# Patient Record
Sex: Female | Born: 1970 | Race: White | Hispanic: No | Marital: Single | Smoking: Never smoker
Health system: Southern US, Community
[De-identification: ages and names within clinical notes are randomized; demographics above are authoritative.]

## PROBLEM LIST (undated history)

## (undated) DIAGNOSIS — C73 Malignant neoplasm of thyroid gland: Secondary | ICD-10-CM

## (undated) HISTORY — PX: THYROID SURGERY: SHX805

## (undated) HISTORY — PX: OTHER SURGICAL HISTORY: SHX169

## (undated) HISTORY — PX: CHOLECYSTECTOMY: SHX55

## (undated) HISTORY — PX: HERNIA REPAIR: SHX51

---

## 2004-10-02 ENCOUNTER — Encounter (HOSPITAL_COMMUNITY): Admission: RE | Admit: 2004-10-02 | Discharge: 2004-12-31 | Payer: Self-pay | Admitting: Endocrinology

## 2005-11-12 ENCOUNTER — Encounter (HOSPITAL_COMMUNITY): Admission: RE | Admit: 2005-11-12 | Discharge: 2005-11-16 | Payer: Self-pay | Admitting: Endocrinology

## 2005-12-05 ENCOUNTER — Ambulatory Visit (HOSPITAL_COMMUNITY): Admission: RE | Admit: 2005-12-05 | Discharge: 2005-12-05 | Payer: Self-pay | Admitting: Endocrinology

## 2005-12-13 ENCOUNTER — Ambulatory Visit (HOSPITAL_COMMUNITY): Admission: RE | Admit: 2005-12-13 | Discharge: 2005-12-13 | Payer: Self-pay | Admitting: Endocrinology

## 2005-12-25 ENCOUNTER — Other Ambulatory Visit: Admission: RE | Admit: 2005-12-25 | Discharge: 2005-12-25 | Payer: Self-pay | Admitting: Interventional Radiology

## 2005-12-25 ENCOUNTER — Encounter (INDEPENDENT_AMBULATORY_CARE_PROVIDER_SITE_OTHER): Payer: Self-pay | Admitting: *Deleted

## 2005-12-25 ENCOUNTER — Encounter: Admission: RE | Admit: 2005-12-25 | Discharge: 2005-12-25 | Payer: Self-pay | Admitting: Endocrinology

## 2006-01-18 ENCOUNTER — Encounter (INDEPENDENT_AMBULATORY_CARE_PROVIDER_SITE_OTHER): Payer: Self-pay | Admitting: Specialist

## 2006-01-18 ENCOUNTER — Inpatient Hospital Stay (HOSPITAL_COMMUNITY): Admission: RE | Admit: 2006-01-18 | Discharge: 2006-01-21 | Payer: Self-pay | Admitting: Otolaryngology

## 2006-02-14 ENCOUNTER — Ambulatory Visit: Admission: RE | Admit: 2006-02-14 | Discharge: 2006-03-11 | Payer: Self-pay | Admitting: Radiation Oncology

## 2006-02-14 ENCOUNTER — Encounter: Admission: RE | Admit: 2006-02-14 | Discharge: 2006-02-14 | Payer: Self-pay | Admitting: Otolaryngology

## 2006-03-19 ENCOUNTER — Ambulatory Visit: Admission: RE | Admit: 2006-03-19 | Discharge: 2006-05-03 | Payer: Self-pay | Admitting: Radiation Oncology

## 2006-06-26 ENCOUNTER — Encounter: Admission: RE | Admit: 2006-06-26 | Discharge: 2006-06-26 | Payer: Self-pay | Admitting: Endocrinology

## 2006-07-26 ENCOUNTER — Ambulatory Visit (HOSPITAL_COMMUNITY): Admission: RE | Admit: 2006-07-26 | Discharge: 2006-07-26 | Payer: Self-pay | Admitting: Otolaryngology

## 2006-07-26 ENCOUNTER — Encounter (INDEPENDENT_AMBULATORY_CARE_PROVIDER_SITE_OTHER): Payer: Self-pay | Admitting: Specialist

## 2006-12-23 ENCOUNTER — Encounter: Admission: RE | Admit: 2006-12-23 | Discharge: 2006-12-23 | Payer: Self-pay | Admitting: Endocrinology

## 2006-12-25 ENCOUNTER — Encounter: Admission: RE | Admit: 2006-12-25 | Discharge: 2006-12-25 | Payer: Self-pay | Admitting: Endocrinology

## 2007-01-08 ENCOUNTER — Ambulatory Visit (HOSPITAL_COMMUNITY): Admission: RE | Admit: 2007-01-08 | Discharge: 2007-01-08 | Payer: Self-pay | Admitting: Endocrinology

## 2007-06-02 ENCOUNTER — Encounter (HOSPITAL_COMMUNITY): Admission: RE | Admit: 2007-06-02 | Discharge: 2007-07-23 | Payer: Self-pay | Admitting: Endocrinology

## 2007-06-04 ENCOUNTER — Ambulatory Visit (HOSPITAL_COMMUNITY): Admission: RE | Admit: 2007-06-04 | Discharge: 2007-06-04 | Payer: Self-pay | Admitting: Endocrinology

## 2008-01-14 ENCOUNTER — Encounter: Admission: RE | Admit: 2008-01-14 | Discharge: 2008-01-14 | Payer: Self-pay | Admitting: Endocrinology

## 2008-01-28 ENCOUNTER — Encounter: Admission: RE | Admit: 2008-01-28 | Discharge: 2008-01-28 | Payer: Self-pay | Admitting: Endocrinology

## 2008-02-19 ENCOUNTER — Other Ambulatory Visit: Admission: RE | Admit: 2008-02-19 | Discharge: 2008-02-19 | Payer: Self-pay | Admitting: Family Medicine

## 2008-04-13 ENCOUNTER — Encounter: Admission: RE | Admit: 2008-04-13 | Discharge: 2008-04-13 | Payer: Self-pay | Admitting: Endocrinology

## 2008-08-16 ENCOUNTER — Encounter (HOSPITAL_COMMUNITY): Admission: RE | Admit: 2008-08-16 | Discharge: 2008-10-28 | Payer: Self-pay | Admitting: Endocrinology

## 2008-08-18 ENCOUNTER — Ambulatory Visit (HOSPITAL_COMMUNITY): Admission: RE | Admit: 2008-08-18 | Discharge: 2008-08-18 | Payer: Self-pay | Admitting: Endocrinology

## 2010-11-18 ENCOUNTER — Encounter: Payer: Self-pay | Admitting: Endocrinology

## 2010-11-19 ENCOUNTER — Encounter: Payer: Self-pay | Admitting: Endocrinology

## 2011-02-27 ENCOUNTER — Other Ambulatory Visit: Payer: Self-pay | Admitting: Family Medicine

## 2011-02-27 ENCOUNTER — Ambulatory Visit
Admission: RE | Admit: 2011-02-27 | Discharge: 2011-02-27 | Disposition: A | Discharge status: Home / Self Care | Payer: Managed Care, Other (non HMO) | Source: Ambulatory Visit | Attending: Family Medicine | Admitting: Family Medicine

## 2011-02-27 DIAGNOSIS — R200 Anesthesia of skin: Secondary | ICD-10-CM

## 2011-03-16 NOTE — Op Note (Signed)
NAMEJOVONDA, SELNER                ACCOUNT NO.:  [id number]   MEDICAL RECORD NO.:  [id number]          PATIENT TYPE:  INP   LOCATION:  5704                         FACILITY:  [facility]   PHYSICIAN:  Kinnie Scales. 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   SURGICAL PROCEDURE:  Left type 1 modified radical neck dissection involving  zones 2, 3, 4, 5 and 6.   ANESTHESIA:  General endotracheal.   SURGEON:  Dr. Annalee Genta   ASSISTANT:  Dr. Flo Shanks.   COMPLICATIONS:  None.   ESTIMATED BLOOD LOSS:  Approximately 100 mL.   The patient transferred from the operating room to recovery room in stable  condition.   BRIEF HISTORY:  Ms. Selner is a 40 year old white female who was referred by  her endocrinologist, Dr. Dorisann Frames for evaluation of possible  metastatic papillary carcinoma of thyroid.  Patient had undergone prior  total thyroidectomy approximately 10 years prior to presentation and had  been followed closely through Dr. Willeen Cass office and was noted to have  rising thyroglobulin levels.  Workup including I-131 scanning was performed.  There is no evidence of significant uptake.  A CT PET scan did not show any  evidence of metastatic disease.  Ultrasound was performed.  The patient was  noted to have several approximately 1.5 cm nodules in the anterior inferior  aspect of the left neck adjacent to the paratracheal region.  Ultrasound-  guided needle biopsy was performed and pathology was consistent with  metastatic papillary carcinoma of the thyroid.  Despite prior therapy, the  patient now had recurrent disease in the neck.  Ultrasound showed multiple  small lymph nodes primarily the left neck and paratracheal region.  Given  the patient's  history, examination and physical findings, I recommended that  we undertake left neck dissection for removal of metastatic papillary  carcinoma of thyroid involving the left neck.  The risks, benefits and  possible complications of surgical procedure were discussed in detail with  the patient and her husband.  They understood and concurred with our plan  for surgery which was scheduled as above.   SURGICAL PROCEDURE:  The patient brought to the operating room [facility] OR on 01/18/2006, placed in supine position on the operating  table.  General endotracheal anesthesia established without difficulty.  When the patient was adequately anesthetized, she was positioned on the  operating table, prepped and draped in sterile fashion.  She was injected  with a total of 4 ml of 1% lidocaine 1:100,000 dilution epinephrine injected  in the deep subcutaneous tissue and proposed skin incision.  The patient's  surgical procedure was then begun by creating a curvilinear incision  beginning in the right anterior neck extending across the previous  thyroidectomy scar and along the left lateral neck to the level of the  mastoid tip.  Incision was carried  through the skin underlying deep  subcutaneous tissue.  The platysma muscle was identified and divided and  subplatysmal flaps elevated superiorly and inferiorly in order to allow  adequate access to the deep compartment of the neck.  There was palpable  nodular disease in the inferior aspect of the left neck at the inferior  aspect of the sternocleidomastoid muscle.  This was consistent with  metastatic thyroid carcinoma.  Dissection then carried out longitudinally  along the mid aspect of the left sternocleidomastoid muscle removing the  fibrofascial layer from mid to anterior and then carrying the dissection  along the underside of the sternocleidomastoid muscle removing fascial  tissue and lymph node bearing tissue.  Dissection was  carried from superior  to inferior exposing the entire sternocleidomastoid muscle.  The superior  aspect of the dissection then carried out along the tail of parotid and  dissection deep to the sternocleidomastoid muscle, identifying the posterior  belly of the digastric muscle.  The spinal accessory nerve was identified  and preserved throughout its course and underlying deep fatty and lymph node  bearing tissue was resected anteriorly and inferiorly.  There were multiple  matted lymph nodes in the left neck and at this point because of the  anatomic architecture, it was opted that we would take the jugular vein with  the surgical specimen.  The superior aspect of the dissection jugular vein  was divided and suture ligated and dissection then carried out along the  digastric muscle anteriorly.  The hypoglossal nerve was identified and  preserved and the overlying veins were dissected and suture ligated.  The  common facial vein was identified and divided and suture ligated.  Dissection was carried out on the anterior aspect of the dissection from  anterior zone 2 along the strap muscles identified the omohyoid.  The  previous surgical dissection in the anterior aspect of the neck was then  palpated and there was nodularity in this region and it was opted to  undertake an extensive zone 6 neck dissection as well.  With the tissues and  turned anteriorly zone 5 of the neck was then dissected preserving the  spinal accessory nerve and dissecting from superior to inferior.  Lymph node  bearing fatty tissue within Zone 5 of the neck was then resected anteriorly  preserving the cervical contributions to the phrenic nerve and brachial  plexus. The inferior aspect of the jugular vein was identified and this was  divided and suture ligated.  The thoracic duct was identified and left intact adjacent to the stump of the jugular vein. The vagus nerve was  identified and traced throughout as was the  carotid artery which was  preserved.  Dissection was then carried out superficial and anterior to the  carotid artery reflecting the lymph node bearing tissue anteriorly.  The  neck dissection was then reflected anteriorly across the strap muscles.  The  omohyoid muscle and sternothyroid muscles were divided. Zone 6 was then  explored beginning in the midline.  The strap muscles which had been  previously closed on themselves were divided and lateralized.  This allowed  direct access to the anterior compartment.  The trachea was palpated and  there were multiple areas of nodular disease in the left paratracheal region  and these were gently dissected from the surrounding tissue, removing all  apparent disease.  Dissection carried along the left aspect of the patient's  trachea and the recurrent laryngeal nerve was identified and preserved  throughout its  course from proximal to distal and its insertion identified  in the cricothyroid membrane.  The zone 6 dissection then separated and sent  to pathology as a separate anatomic specimen.  The remainder the neck  dissection was then removed from the surgical field along the anterior  aspect of the inferior carotid artery and sent to pathology.  This included  zones 2, 3, 4, and 5 of the left neck.  The patient's neck incision was then  thoroughly irrigated.  Several areas of point hemorrhage were cauterized  with bipolar cautery.  The wound was dry and intact at the conclusion of the  surgical procedure.  A 10-mm Blake drain was then placed through a separate  stab incision and sutured in place with the 3-0 Ethilon suture.  This  carried to the deep aspect of the wound.  The neck incision then closed in  multiple layers consisting of reapproximation of the platysma muscle with a  3-0 Vicryl suture.  Deep subcutaneous tissues closed with interrupted 4-0  Vicryl suture and final skin edges were approximated with a 5-0 Ethilon  suture in running  locked fashion.  The patient's neck skin was then cleaned  and dressed with bacitracin ointment.  She awakened from anesthetic,  extubated, and was then transferred from the operating room to recovery room  in stable condition.  There no complications.  Blood loss approximately 100  mL.           ______________________________  Kinnie Scales. Annalee Genta, M.D.     DLS/MEDQ  D:  91/47/8295  T:  01/21/2006  Job:  [id number]

## 2011-03-16 NOTE — Op Note (Signed)
Ebony Mahoney, Ebony Mahoney                ACCOUNT NO.:  [id number]   MEDICAL RECORD NO.:  [id number]          PATIENT TYPE:  AMB   LOCATION:  SDS                          FACILITY:  [facility]   PHYSICIAN:  Onalee Hua L. Annalee Genta, M.D.DATE OF BIRTH:  07-07-1971   DATE OF PROCEDURE:  07/26/2006  DATE OF DISCHARGE:  07/26/2006                                 OPERATIVE REPORT   INDICATIONS FOR SURGERY:  1. Left posterior deep neck mass.  2. History of metastatic papillary thyroid carcinoma.  3. Status post left neck dissection.   POSTOPERATIVE DIAGNOSES:  1. Left posterior deep neck mass.  2. History of metastatic papillary thyroid carcinoma.  3. Status post left neck dissection.   SURGICAL PROCEDURE:  Excisional biopsy left deep neck mass.   SURGEON:  Kinnie Scales. Annalee Genta, M.D.   ANESTHESIA:  General.   COMPLICATIONS:  None.   BLOOD LOSS:  Minimal.   The patient was transferred from the operating room to the recovery room in  stable condition.   BRIEF HISTORY:  Ms. Sinning is a 40 year old white female who has been  followed with a history of metastatic papillary thyroid carcinoma.  The  patient underwent initial thyroidectomy for papillary carcinoma  approximately 10 years ago.  She was stable until the last 18 months, when  she developed swelling in the left neck.  Biopsy was performed, and findings  were consistent with metastatic papillary thyroid carcinoma.  The patient  underwent neck dissection with extensive anterior compartment and zone VI  dissection performed in the spring of 2007.  In followup, the patient did  very well after her surgical procedure, and follow-up PET scanning and  thyroglobulin levels were stable.  The patient was noted on physical  examination to have a small, firm mass in the posterior aspect of the left  neck.  Palpation over several months reveals an approximately 1 cm firm  mass.  Given the patient's history and physical examination, we decided to  proceed  with surgical excision of the mass. This was scheduled as an  outpatient under general anesthesia at [facility]. The risks,  benefits, and possible complications of the surgical procedure were  discussed in detail with the patient, and she understood and concurred with  our plan for surgery, which was scheduled as above.   PROCEDURE:  The patient was brought to the operating room at [facility] on July 26, 2006.  She was placed in the supine position on  the operating table.  General endotracheal anesthesia was established  without difficulty, and the patient adequately anesthetized.  She was  positioned on the operating table and prepped and draped in a sterile  fashion.  The left neck was palpated and injected with about 1 cc of 1%  lidocaine with 1:100,000 epinephrine which was injected in a submucosal  fashion along the preexisting incision line.  The patient was then prepped  and draped in a sterile fashion, and the surgical procedure was begun by  creating an approximately 2 cm incision in the preexisting left neck scar.  This was carried through  the skin and underlying deep subcutaneous tissue.  The platysma muscle had been divided during the previous surgical procedure,  and the sternocleidomastoid muscle was palpated.  The area of concern was  deep to the sternocleidomastoid muscle in the posterior aspect of the neck.  The muscle was divided, and the spinal accessory nerve was identified  crossing the surgical field from superior to lateral. The nerve was  retracted gently and preserved throughout its course.  Dissection was then  carried out in the deep compartment of the neck, and a small nodular mass  was excised. This may have been consistent with scarred muscle or possible  neuroma. There was no other adenopathy or palpable mass.  The patient's  wound was then thoroughly irrigated with saline solution and closed in  layers, with reapproximation of the  sternocleidomastoid muscle with 4-0  Vicryl suture.  Deep subcutaneous tissue was closed with 4-0 Vicryl in an  interrupted fashion, and final skin closure was achieved with a 5-0 Ethilon  suture in a running-locked fashion.  The patient's wound was then dressed  with bacitracin ointment.  She was awakened from anesthetic, extubated, and  then transferred from the operating room to the recovery room in stable  condition.  No complications.  Blood loss was minimal.           ______________________________  Kinnie Scales. Annalee Genta, M.D.     DLS/MEDQ  D:  16/07/9603  T:  07/28/2006  Job:  [id number]

## 2011-03-16 NOTE — Discharge Summary (Signed)
NAMELETTI, TOWELL                ACCOUNT NO.:  [id number]   MEDICAL RECORD NO.:  [id number]          PATIENT TYPE:  INP   LOCATION:  5704                         FACILITY:  [facility]   PHYSICIAN:  Kinnie Scales. Annalee Genta, M.D.DATE OF BIRTH:  1970-12-25   DATE OF ADMISSION:  01/18/2006  DATE OF DISCHARGE:  01/21/2006                                 DISCHARGE SUMMARY   LOCATION:  [facility].   ADMISSION/DISCHARGE DIAGNOSES:  1.  Metastatic thyroid papillary carcinoma.  2.  Status post prior total thyroidectomy.  3.  Radioactive iodine therapy.   SURGICAL PROCEDURES THIS ADMISSION:  Left modified radical neck dissection  performed on 12/21/05.  The patient was discharged to home in stable  condition at the company of her husband.   DISCHARGE MEDICATIONS:  1.  Augmentin 500 mg p.o. b.i.d. for 10 days.  2. Percocet 5/325 one to two      p.o. q.4-6 h. as needed for pain.  3. Tums chewable, 2 tablets 3 times a      day.   WOUND CARE:  Half strength hydrogen peroxide and Bacitracin ointment on  twice daily basis.   DIETARY RESTRICTIONS:  None.   PHYSICAL ACTIVITY:  Restricted to walking.  No lifting, straining, or  driving for 2 weeks.  The patient may bathe and shower on January 23, 2006.   BRIEF HISTORY:  Ms. Sapien is a 40 year old white female who has been  followed closely by her endocrinologist, Dr. Dorisann Frames, for long-term  management of thyroid carcinoma.  The patient underwent a total  thyroidectomy approximately 10 years ago while living out of sate for  papillary carcinoma of the thyroid.  She was treated with multiple courses  of radioactive iodine and appeared to be disease free.  Over the last 3-4  months, she has had increasing levels of thyroglobulin and workup through  Dr. Willeen Cass office included repeat radioactive iodine therapy and PET CT  scan showed no evidence of recurrent disease.  A followup ultrasound showed  lymph nodes in the left neck, paratracheal  region.  Ultrasound guided biopsy  was positive for metastatic papillary carcinoma.  Given the patient's  history and examination, I recommended surgical intervention to include  residual disease.  In discussing the risks, benefits of possible  complications of procedure, we discussed a left neck dissection, possible  modified radical neck dissection and possible zone 6 neck dissection.  The  patient and her husband understood and conferred with our plan for surgery,  which was scheduled for 01/18/06.   HOSPITAL COURSE:  The patient was admitted to [facility] on  01/18/06, and taken from day surgery to the operating room where she  underwent a left neck dissection under general anesthesia.  This consisted  of a modified radical neck dissection preserving the spinal accessory nerve  and sternocleidomastoid muscles sacrificing the jugular vein.  The patient  had apparent metastatic disease in zones 4, 5 and 6 and the neck dissection  included zones 2, 3, 4, 5, and separate section through the previous  thyroidectomy bed and anterior compartment of  the neck (zone 6).  The  patient's surgical procedure was successful and without complications.  She  was transferred to the operating room to the recovery room in stable  condition.   The patient was transferred from recovery to unit 5700 for postoperative  care.  Postoperative course was uneventful and the patient had good pain  control using prescribed oral narcotics.  She did complain of severe  headache, which was treated with intravenous morphine.  The patient's  calcium level was borderline low and she was treated with multivitamin with  calcium.  She was stable without symptoms of hypocalcemia.  She had good  voicing and was tolerating oral diet without difficulty.   On the third postoperative day, the patient's Al Pimple drain output had  fallen to less than 10 mL per shift and the drain was removed without  difficulty.  On  the morning of 01/21/06, the patient's incision was dry and  intact.  Minimal peri-incisional ecchymoses, no erythema or swelling.  The  patient had normal bowel and bladder function, was ambulating well without  difficulty.  She was discharged to home in stable condition in the company  of her husband with the above discharge instructions.   FOLLOWUP PLAN:  The patient will follow up in my office on 01/27/06, for  further postoperative care.           ______________________________  Kinnie Scales. Annalee Genta, M.D.     DLS/MEDQ  D:  16/07/9603  T:  01/22/2006  Job:  [id number]   cc:   Dorisann Frames, M.D.  Fax: [phone]

## 2011-07-30 LAB — HCG, SERUM, QUALITATIVE: Preg, Serum: NEGATIVE

## 2011-08-13 LAB — HCG, SERUM, QUALITATIVE: Preg, Serum: NEGATIVE

## 2011-10-11 ENCOUNTER — Encounter: Payer: Self-pay | Admitting: Emergency Medicine

## 2011-10-11 ENCOUNTER — Emergency Department (HOSPITAL_COMMUNITY): Payer: Managed Care, Other (non HMO)

## 2011-10-11 ENCOUNTER — Emergency Department (HOSPITAL_COMMUNITY)
Admission: EM | Admit: 2011-10-11 | Discharge: 2011-10-11 | Disposition: A | Discharge status: Home / Self Care | Payer: Managed Care, Other (non HMO) | Attending: Emergency Medicine | Admitting: Emergency Medicine

## 2011-10-11 DIAGNOSIS — Z85850 Personal history of malignant neoplasm of thyroid: Secondary | ICD-10-CM | POA: Insufficient documentation

## 2011-10-11 DIAGNOSIS — R51 Headache: Secondary | ICD-10-CM | POA: Insufficient documentation

## 2011-10-11 DIAGNOSIS — M542 Cervicalgia: Secondary | ICD-10-CM | POA: Insufficient documentation

## 2011-10-11 MED ORDER — OXYCODONE-ACETAMINOPHEN 5-325 MG PO TABS
1.0000 | ORAL_TABLET | Freq: Once | ORAL | Status: AC
  Administered 2011-10-11: 1 via ORAL
  Filled 2011-10-11: qty 1

## 2011-10-11 MED ORDER — DIAZEPAM 5 MG PO TABS: 5.0000 mg | ORAL_TABLET | Freq: Two times a day (BID) | ORAL | Status: AC

## 2011-10-11 MED ORDER — IBUPROFEN 800 MG PO TABS
800.0000 mg | ORAL_TABLET | Freq: Once | ORAL | Status: AC
  Administered 2011-10-11: 800 mg via ORAL
  Filled 2011-10-11: qty 1

## 2011-10-11 MED ORDER — LORAZEPAM 2 MG/ML IJ SOLN
1.0000 mg | Freq: Once | INTRAMUSCULAR | Status: AC
  Administered 2011-10-11: 1 mg via INTRAVENOUS
  Filled 2011-10-11: qty 1

## 2011-10-11 MED ORDER — OXYCODONE-ACETAMINOPHEN 5-325 MG PO TABS: 2.0000 | ORAL_TABLET | ORAL | Status: AC | PRN

## 2011-10-11 MED ORDER — IBUPROFEN 600 MG PO TABS: 600.0000 mg | ORAL_TABLET | Freq: Four times a day (QID) | ORAL | Status: AC | PRN
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
## 2011-10-11 NOTE — ED Notes (Signed)
Hx of multiple neck surgeries
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
## 2011-10-12 NOTE — ED Provider Notes (Signed)
Medical screening examination/treatment/procedure(s) were performed by non-physician practitioner and as supervising physician I was immediately available for consultation/collaboration.   Yobana Culliton A Madilyne Tadlock, MD 10/12/11 1416 

## 2011-11-12 ENCOUNTER — Other Ambulatory Visit (HOSPITAL_COMMUNITY): Payer: Self-pay | Admitting: Endocrinology

## 2011-11-12 DIAGNOSIS — C73 Malignant neoplasm of thyroid gland: Secondary | ICD-10-CM

## 2011-12-10 ENCOUNTER — Encounter (HOSPITAL_COMMUNITY)
Admission: RE | Admit: 2011-12-10 | Discharge: 2011-12-10 | Disposition: A | Discharge status: Home / Self Care | Payer: Managed Care, Other (non HMO) | Source: Ambulatory Visit | Attending: Endocrinology | Admitting: Endocrinology

## 2011-12-10 DIAGNOSIS — C73 Malignant neoplasm of thyroid gland: Secondary | ICD-10-CM

## 2011-12-10 MED ORDER — THYROTROPIN ALFA 1.1 MG IM SOLR
0.9000 mg | INTRAMUSCULAR | Status: DC
  Filled 2011-12-10: qty 0.9

## 2011-12-11 ENCOUNTER — Ambulatory Visit (HOSPITAL_COMMUNITY)
Admission: RE | Admit: 2011-12-11 | Discharge: 2011-12-11 | Disposition: A | Discharge status: Home / Self Care | Payer: Managed Care, Other (non HMO) | Source: Ambulatory Visit | Attending: Endocrinology | Admitting: Endocrinology

## 2011-12-12 ENCOUNTER — Encounter (HOSPITAL_COMMUNITY)
Admission: RE | Admit: 2011-12-12 | Discharge: 2011-12-12 | Disposition: A | Discharge status: Home / Self Care | Payer: Managed Care, Other (non HMO) | Source: Ambulatory Visit | Attending: Endocrinology | Admitting: Endocrinology

## 2011-12-12 LAB — HCG, SERUM, QUALITATIVE: Preg, Serum: NEGATIVE

## 2011-12-14 ENCOUNTER — Encounter (HOSPITAL_COMMUNITY)
Admission: RE | Admit: 2011-12-14 | Discharge: 2011-12-14 | Disposition: A | Discharge status: Home / Self Care | Payer: Managed Care, Other (non HMO) | Source: Ambulatory Visit | Attending: Endocrinology | Admitting: Endocrinology

## 2011-12-14 ENCOUNTER — Encounter (HOSPITAL_COMMUNITY): Payer: Self-pay

## 2011-12-14 HISTORY — DX: Malignant neoplasm of thyroid gland: C73

## 2011-12-14 MED ORDER — SODIUM IODIDE I 131 CAPSULE
4.2000 | Freq: Once | INTRAVENOUS | Status: AC | PRN
  Administered 2011-12-14: 4.2 via ORAL

## 2012-09-05 MED FILL — Thyrotropin Alfa For Inj 1.1 MG: INTRAMUSCULAR | Qty: 0.9 | Status: AC

## 2012-10-13 ENCOUNTER — Other Ambulatory Visit: Payer: Self-pay | Admitting: Family Medicine

## 2012-10-13 ENCOUNTER — Other Ambulatory Visit (HOSPITAL_COMMUNITY)
Admission: RE | Admit: 2012-10-13 | Discharge: 2012-10-13 | Disposition: A | Discharge status: Home / Self Care | Payer: Managed Care, Other (non HMO) | Source: Ambulatory Visit | Attending: Endocrinology | Admitting: Endocrinology

## 2012-10-13 DIAGNOSIS — Z0000 Encounter for general adult medical examination without abnormal findings: Secondary | ICD-10-CM | POA: Insufficient documentation

## 2012-10-13 DIAGNOSIS — N6320 Unspecified lump in the left breast, unspecified quadrant: Secondary | ICD-10-CM

## 2012-10-13 DIAGNOSIS — Z113 Encounter for screening for infections with a predominantly sexual mode of transmission: Secondary | ICD-10-CM | POA: Insufficient documentation

## 2012-10-23 ENCOUNTER — Ambulatory Visit
Admission: RE | Admit: 2012-10-23 | Discharge: 2012-10-23 | Disposition: A | Discharge status: Home / Self Care | Payer: Managed Care, Other (non HMO) | Source: Ambulatory Visit | Attending: Family Medicine | Admitting: Family Medicine

## 2012-10-23 DIAGNOSIS — N6320 Unspecified lump in the left breast, unspecified quadrant: Secondary | ICD-10-CM

## 2013-10-05 ENCOUNTER — Other Ambulatory Visit: Payer: Self-pay

## 2013-10-05 DIAGNOSIS — Z1231 Encounter for screening mammogram for malignant neoplasm of breast: Secondary | ICD-10-CM

## 2013-10-19 ENCOUNTER — Ambulatory Visit
Admission: RE | Admit: 2013-10-19 | Discharge: 2013-10-19 | Disposition: A | Discharge status: Home / Self Care | Payer: Managed Care, Other (non HMO) | Source: Ambulatory Visit | Attending: Family | Admitting: Family

## 2013-10-19 ENCOUNTER — Other Ambulatory Visit: Payer: Self-pay | Admitting: Family

## 2013-10-19 DIAGNOSIS — M549 Dorsalgia, unspecified: Secondary | ICD-10-CM

## 2013-10-28 ENCOUNTER — Ambulatory Visit
Admission: RE | Admit: 2013-10-28 | Discharge: 2013-10-28 | Disposition: A | Discharge status: Home / Self Care | Payer: Managed Care, Other (non HMO) | Source: Ambulatory Visit

## 2013-10-28 DIAGNOSIS — Z1231 Encounter for screening mammogram for malignant neoplasm of breast: Secondary | ICD-10-CM

## 2013-11-09 ENCOUNTER — Ambulatory Visit: Payer: Managed Care, Other (non HMO)

## 2014-08-24 ENCOUNTER — Other Ambulatory Visit: Payer: Self-pay | Admitting: Family Medicine

## 2014-08-24 ENCOUNTER — Other Ambulatory Visit (HOSPITAL_COMMUNITY)
Admission: RE | Admit: 2014-08-24 | Discharge: 2014-08-24 | Disposition: A | Discharge status: Home / Self Care | Payer: Managed Care, Other (non HMO) | Source: Ambulatory Visit | Attending: Family Medicine | Admitting: Family Medicine

## 2014-08-24 DIAGNOSIS — Z01411 Encounter for gynecological examination (general) (routine) with abnormal findings: Secondary | ICD-10-CM | POA: Diagnosis present

## 2014-08-26 LAB — CYTOLOGY - PAP

## 2014-09-07 ENCOUNTER — Other Ambulatory Visit: Payer: Self-pay | Admitting: Obstetrics & Gynecology

## 2014-09-16 ENCOUNTER — Other Ambulatory Visit: Payer: Self-pay

## 2014-09-16 DIAGNOSIS — Z1231 Encounter for screening mammogram for malignant neoplasm of breast: Secondary | ICD-10-CM

## 2014-11-01 ENCOUNTER — Ambulatory Visit
Admission: RE | Admit: 2014-11-01 | Discharge: 2014-11-01 | Disposition: A | Discharge status: Home / Self Care | Payer: BLUE CROSS/BLUE SHIELD | Source: Ambulatory Visit

## 2014-11-01 DIAGNOSIS — Z1231 Encounter for screening mammogram for malignant neoplasm of breast: Secondary | ICD-10-CM

## 2015-09-12 ENCOUNTER — Other Ambulatory Visit: Payer: Self-pay | Admitting: Family Medicine

## 2015-09-12 ENCOUNTER — Ambulatory Visit
Admission: RE | Admit: 2015-09-12 | Discharge: 2015-09-12 | Disposition: A | Discharge status: Home / Self Care | Payer: BLUE CROSS/BLUE SHIELD | Source: Ambulatory Visit | Attending: Family Medicine | Admitting: Family Medicine

## 2015-09-12 DIAGNOSIS — R053 Chronic cough: Secondary | ICD-10-CM

## 2015-09-12 DIAGNOSIS — R05 Cough: Secondary | ICD-10-CM

## 2015-12-07 ENCOUNTER — Other Ambulatory Visit: Payer: Self-pay

## 2015-12-07 DIAGNOSIS — Z1231 Encounter for screening mammogram for malignant neoplasm of breast: Secondary | ICD-10-CM

## 2016-03-05 ENCOUNTER — Ambulatory Visit
Admission: RE | Admit: 2016-03-05 | Discharge: 2016-03-05 | Disposition: A | Discharge status: Home / Self Care | Payer: BLUE CROSS/BLUE SHIELD | Source: Ambulatory Visit

## 2016-03-05 DIAGNOSIS — Z1231 Encounter for screening mammogram for malignant neoplasm of breast: Secondary | ICD-10-CM

## 2016-08-31 ENCOUNTER — Other Ambulatory Visit: Payer: Self-pay | Admitting: Family Medicine

## 2016-08-31 ENCOUNTER — Ambulatory Visit
Admission: RE | Admit: 2016-08-31 | Discharge: 2016-08-31 | Disposition: A | Discharge status: Home / Self Care | Payer: BLUE CROSS/BLUE SHIELD | Source: Ambulatory Visit | Attending: Family Medicine | Admitting: Family Medicine

## 2016-08-31 DIAGNOSIS — N200 Calculus of kidney: Secondary | ICD-10-CM

## 2018-07-07 ENCOUNTER — Other Ambulatory Visit: Payer: Self-pay | Admitting: Obstetrics & Gynecology

## 2018-07-07 ENCOUNTER — Ambulatory Visit
Admission: RE | Admit: 2018-07-07 | Discharge: 2018-07-07 | Disposition: A | Discharge status: Home / Self Care | Payer: Commercial Managed Care - PPO | Source: Ambulatory Visit | Attending: Obstetrics & Gynecology | Admitting: Obstetrics & Gynecology

## 2018-07-07 DIAGNOSIS — Z1231 Encounter for screening mammogram for malignant neoplasm of breast: Secondary | ICD-10-CM

## 2019-06-24 ENCOUNTER — Other Ambulatory Visit: Payer: Self-pay | Admitting: Family Medicine

## 2019-06-24 ENCOUNTER — Ambulatory Visit
Admission: RE | Admit: 2019-06-24 | Discharge: 2019-06-24 | Disposition: A | Discharge status: Home / Self Care | Payer: Commercial Managed Care - PPO | Source: Ambulatory Visit | Attending: Family Medicine | Admitting: Family Medicine

## 2019-06-24 DIAGNOSIS — R0602 Shortness of breath: Secondary | ICD-10-CM

## 2019-11-17 ENCOUNTER — Ambulatory Visit: Payer: Commercial Managed Care - PPO | Admitting: Internal Medicine

## 2019-11-17 ENCOUNTER — Encounter: Payer: Self-pay | Admitting: Internal Medicine

## 2019-11-17 ENCOUNTER — Other Ambulatory Visit: Payer: Self-pay

## 2019-11-17 VITALS — BP 132/80 | HR 71 | Temp 98.3°F | Ht 60.0 in | Wt 176.6 lb

## 2019-11-17 DIAGNOSIS — R05 Cough: Secondary | ICD-10-CM | POA: Diagnosis not present

## 2019-11-17 DIAGNOSIS — R059 Cough, unspecified: Secondary | ICD-10-CM

## 2019-11-17 MED ORDER — LORATADINE 10 MG PO TABS: 10.0000 mg | ORAL_TABLET | Freq: Every day | ORAL | 3 refills | Status: DC

## 2019-11-17 MED ORDER — FAMOTIDINE 20 MG PO TABS: 20.0000 mg | ORAL_TABLET | Freq: Two times a day (BID) | ORAL | 3 refills | Status: DC

## 2019-11-17 MED ORDER — OMEPRAZOLE 20 MG PO CPDR
20.0000 mg | DELAYED_RELEASE_CAPSULE | Freq: Two times a day (BID) | ORAL | 3 refills | Status: DC
Start: 2019-11-17 — End: 2020-02-08

## 2019-11-17 MED ORDER — MONTELUKAST SODIUM 10 MG PO TABS: 10.0000 mg | ORAL_TABLET | Freq: Every day | ORAL | 3 refills | Status: DC

## 2019-11-17 NOTE — Progress Notes (Signed)
X °

## 2019-11-17 NOTE — Progress Notes (Signed)
Synopsis: Referred in Jan 2021 for cough by Jacelyn Pi, MD  Subjective:   PATIENT ID: Ebony Mahoney GENDER: female DOB: 04-14-1971, MRN: [id number]  Chief Complaint  Patient presents with  . Consult    Patient is here for chronic dry cough and feeling like she constantly has to clear her throat. Patient was started on Albuterol rescue inhaler and Flovent HFA but has not noticed a difference.    HPI  Here for 3-4 months of cough - No preceding illness - No recent travel - No wheezing - Nonproductive - Almost like an itching in back of throat - No history of allergies or asthma - Is on omeprazole for GERD - On CPAP but cleaning machine regularly - No aspiration symptoms - No obvious exposures - Minimal postnasal drip - Worsened by talking - No benefit to trial of ICS and albuterol  Also chest tightness - Mild, constant, unaffected by time of day, position, activity, breathing - Midsternum in location - No relieving or exacerbating factors that we could figure out - No obvious relationship to cough either  Notably has history of thyroid 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, records not available on Epic due to being so long ago (2008)  ROS Positive Symptoms in bold:  Constitutional fevers, chills, weight loss, fatigue, anorexia, malaise  Eyes decreased vision, double vision, eye irritation  Ears, Nose, Mouth, Throat sore throat, trouble swallowing, sinus congestion  Cardiovascular chest pain, paroxysmal nocturnal dyspnea, lower ext edema, palpitations   Respiratory SOB, cough, DOE, hemoptysis, wheezing  Gastrointestinal nausea, vomiting, diarrhea  Genitourinary burning with urination, trouble urinating  Musculoskeletal joint aches, joint swelling, back pain  Integumentary  rashes, skin lesions  Neurological focal weakness, focal numbness, trouble speaking, headaches  Psychiatric depression, anxiety, confusion  Endocrine polyuria,  polydipsia, cold intolerance, heat intolerance  Hematologic abnormal bruising, abnormal bleeding, unexplained nose bleeds  Allergic/Immunologic recurrent infections, hives, swollen lymph nodes    Past Medical History:  Diagnosis Date  . Thyroid carcinoma ([place])      Family History  Problem Relation Age of Onset  . Cancer Mother   . Aneurysm Father   . Asthma Sister      Past Surgical History:  Procedure Laterality Date  . CHOLECYSTECTOMY      Social History   Socioeconomic History  . Marital status: Single    Spouse name: Not on file  . Number of children: Not on file  . Years of education: Not on file  . Highest education level: Not on file  Occupational History  . Not on file  Tobacco Use  . Smoking status: Never Smoker  . Smokeless tobacco: Never Used  Substance and Sexual Activity  . Alcohol use: Not Currently    Comment: Rare  . Drug use: Never  . Sexual activity: Not on file  Other Topics Concern  . Not on file  Social History Narrative  . Not on file   Social Determinants of Health   Financial Resource Strain:   . Difficulty of Paying Living Expenses: Not on file  Food Insecurity:   . Worried About Charity fundraiser in the Last Year: Not on file  . Ran Out of Food in the Last Year: Not on file  Transportation Needs:   . Lack of Transportation (Medical): Not on file  . Lack of Transportation (Non-Medical): Not on file  Physical Activity:   . Days of Exercise per Week: Not on file  . Minutes  of Exercise per Session: Not on file  Stress:   . Feeling of Stress : Not on file  Social Connections:   . Frequency of Communication with Friends and Family: Not on file  . Frequency of Social Gatherings with Friends and Family: Not on file  . Attends Religious Services: Not on file  . Active Member of Clubs or Organizations: Not on file  . Attends Archivist Meetings: Not on file  . Marital Status: Not on file  Intimate Partner Violence:   .  Fear of Current or Ex-Partner: Not on file  . Emotionally Abused: Not on file  . Physically Abused: Not on file  . Sexually Abused: Not on file     No Known Allergies   Outpatient Medications Prior to Visit  Medication Sig Dispense Refill  . Ascorbic Acid (VITAMIN C) 1000 MG tablet Take 1,000 mg by mouth daily.      Marland Kitchen thyroid (ARMOUR) 60 MG tablet Take 60 mg by mouth daily.      Marland Kitchen thyroid (ARMOUR) 90 MG tablet Take 90 mg by mouth daily.       No facility-administered medications prior to visit.     Objective:  GEN: pleasant woman in NAD HEENT: malampatti 4, very irritated dry posterior pharyngeal mucosa, no focal wheeze or stridor in laryngeal area by auscultation CV: RRR, ext warm PULM: clear, no wheezing GI: Soft, +BS EXT: No edema NEURO: Ambulates normally PSYCH: AOx3, excellent insight SKIN: No rashes    Vitals:   11/17/19 0901  BP: 132/80  Pulse: 71  Temp: 98.3 F (36.8 C)  TempSrc: Temporal  SpO2: 96%  Weight: 176 lb 9.6 oz (80.1 kg)  Height: 5' (1.524 m)   96% on RA BMI Readings from Last 3 Encounters:  11/17/19 34.49 kg/m   Wt Readings from Last 3 Encounters:  11/17/19 176 lb 9.6 oz (80.1 kg)  CXR benign    Assessment & Plan:  # 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  Not consistent with asthma or bronchitis.  Would target common laryngitis irritants to start.  If no improvement, would get esophagram to rule out obvious spasm or stricture given her chest tightness.  Finally, if medicines do not work and esophagram unrevealing, probably needs to get laryngoscopy and consideration for vocal cord injections.  - Double PPI, start H2B, singulair, claritin - If no improvement in 2 weeks patient will call and we will order esophagram - If esophagram unrevealing, referral to [facility] E[facility] - Tentative 4 week f/u phone appt   Current Outpatient Medications:  .   Ascorbic Acid (VITAMIN C) 1000 MG tablet, Take 1,000 mg by mouth daily.  , Disp: , Rfl:  .  famotidine (PEPCID) 20 MG tablet, Take 1 tablet (20 mg total) by mouth 2 (two) times daily., Disp: 60 tablet, Rfl: 3 .  loratadine (CLARITIN) 10 MG tablet, Take 1 tablet (10 mg total) by mouth daily., Disp: 30 tablet, Rfl: 3 .  montelukast (SINGULAIR) 10 MG tablet, Take 1 tablet (10 mg total) by mouth at bedtime., Disp: 30 tablet, Rfl: 3 .  omeprazole (PRILOSEC) 20 MG capsule, Take 1 capsule (20 mg total) by mouth 2 (two) times daily before a meal., Disp: 60 capsule, Rfl: 3 .  thyroid (ARMOUR) 60 MG tablet, Take 60 mg by mouth daily.  , Disp: , Rfl:  .  thyroid (ARMOUR) 90 MG tablet, Take 90 mg by  mouth daily.  , Disp: , Rfl:    Candee Furbish, MD [place] [facility] 11/17/2019 9:32 AM

## 2019-11-17 NOTE — Patient Instructions (Addendum)
Stop inhalers  First empirically treat allergies and GERD: - Omeprazole two times daily - Claritin daily - Pepcid two times daily - Singulair  If no improvement in two weeks, call and I will order and esophagram.  If esophagram unrevealing, we will re-refer you to [facility] ENT to consider injecting your vocal cords.

## 2019-12-01 ENCOUNTER — Telehealth: Payer: Self-pay | Admitting: Internal Medicine

## 2019-12-01 DIAGNOSIS — R059 Cough, unspecified: Secondary | ICD-10-CM

## 2019-12-01 DIAGNOSIS — R05 Cough: Secondary | ICD-10-CM

## 2019-12-01 MED ORDER — BENZONATATE 200 MG PO CAPS: 200.0000 mg | ORAL_CAPSULE | Freq: Three times a day (TID) | ORAL | 0 refills | Status: DC | PRN

## 2019-12-01 NOTE — Telephone Encounter (Signed)
Thanks Tammy 

## 2019-12-01 NOTE — Telephone Encounter (Signed)
Called the patient and made her aware of response received from app of the day, Rexene Edison, NP.  She voiced understanding and will keep the televisit scheduled for 12/15/19 at 11:15 with Dr. Tamala Julian.  Advised Delsym is OTC, but prescription for Tessalon will be sent to the pharmacy.

## 2019-12-01 NOTE — Telephone Encounter (Signed)
Would proceed with esophagram as rec by Tamala Julian ,MD  Can add delsym 2 tsp Twice daily  For cough, As needed  Add Tessalon 200 mgThree times a day  For cough , as needed #45 , no refills.   Take on regular basis to help prevent cough and let upper airway heal. Sips of water to soothe throat . No throat clearing  No MINTS  Voice rest .  Drink water after eating or drinking to clear anything residuals off the upper airway /throat .    Keep follow up with Tamala Julian , MD   Please contact office for sooner follow up if symptoms do not improve or worsen or seek emergency care

## 2019-12-01 NOTE — Telephone Encounter (Signed)
Spoke with pt. States that she is not feeling better since her last OV with Dr. Tamala Julian. Pt reports that she is still having issues with coughing, chest tightness, throat clearing and fatigue. Per the AVS instructions, Dr. Tamala Julian wanted her to have an esophogram in 2 weeks if she did not have any improvement. This has been ordered, in the mean time the pt would like to know if there is anything additional that can be added.  Tammy - please advise. Thanks.  Instructions from OV with Dr. Tamala Julian:   Return in about 4 weeks (around 12/15/2019). Stop inhalers  First empirically treat allergies and GERD: - Omeprazole two times daily - Claritin daily - Pepcid two times daily - Singulair  If no improvement in two weeks, call and I will order and esophagram.  If esophagram unrevealing, we will re-refer you to [facility] to consider injecting your vocal cords.

## 2019-12-11 ENCOUNTER — Ambulatory Visit
Admission: RE | Admit: 2019-12-11 | Discharge: 2019-12-11 | Disposition: A | Discharge status: Home / Self Care | Payer: Commercial Managed Care - PPO | Source: Ambulatory Visit | Attending: Internal Medicine | Admitting: Internal Medicine

## 2019-12-11 ENCOUNTER — Other Ambulatory Visit: Payer: Self-pay | Admitting: Internal Medicine

## 2019-12-11 ENCOUNTER — Telehealth: Payer: Self-pay | Admitting: Internal Medicine

## 2019-12-11 DIAGNOSIS — R05 Cough: Secondary | ICD-10-CM

## 2019-12-11 DIAGNOSIS — R059 Cough, unspecified: Secondary | ICD-10-CM

## 2019-12-11 NOTE — Telephone Encounter (Signed)
Called the patient and made her aware Dr. Tamala Julian is at the hospital today based on the schedule and he will review the results and advise of the results we need to relay to her as soon as he can.  Patient voiced understanding, nothing further needed at this time.

## 2019-12-12 NOTE — Progress Notes (Signed)
Visit done by telephone to reduce exposure to COVID with permission obtained from patient.  Synopsis: Referred in Jan 2021 for cough by Lujean Amel, MD  Subjective:   PATIENT ID: Ebony Mahoney GENDER: female DOB: [birth year], MRN: [id number]  No chief complaint on file.   HPI  Persistent cough and chest tightness, details below.  History c/w laryngeal irritation vs, esophageal spasm so last visit started double PPI, H2B, singulair, claritin. 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   Here for 3-4 months of cough - No preceding illness - No recent travel - No wheezing - Nonproductive - Almost like an itching in back of throat - No history of allergies or asthma - Is on omeprazole for GERD - On CPAP but cleaning machine regularly - No aspiration symptoms - No obvious exposures - Minimal postnasal drip - Worsened by talking - No benefit to trial of ICS and albuterol  Also chest tightness - Mild, constant, unaffected by time of day, position, activity, breathing - Midsternum in location - No relieving or exacerbating factors that we could figure out - No obvious relationship to cough either  Notably has history of thyroid 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, records not available on Epic due to being so long ago (2008)  ROS + symptoms in bold Fevers, chills, weight loss Nausea, vomiting, diarrhea Shortness of breath, wheezing, cough Chest pain, palpitations, lower ext edema   Past Medical History:  Diagnosis Date  . Thyroid carcinoma ([place])      Family History  Problem Relation Age of Onset  . Cancer Mother   . Aneurysm Father   . Asthma Sister      Past Surgical History:  Procedure Laterality Date  . CHOLECYSTECTOMY      Social History   Socioeconomic History  . Marital status: Single    Spouse name: Not on file  . Number of children: Not  on file  . Years of education: Not on file  . Highest education level: Not on file  Occupational History  . Not on file  Tobacco Use  . Smoking status: Never Smoker  . Smokeless tobacco: Never Used  Substance and Sexual Activity  . Alcohol use: Not Currently    Comment: Rare  . Drug use: Never  . Sexual activity: Not on file  Other Topics Concern  . Not on file  Social History Narrative  . Not on file   Social Determinants of Health   Financial Resource Strain:   . Difficulty of Paying Living Expenses: Not on file  Food Insecurity:   . Worried About Charity fundraiser in the Last Year: Not on file  . Ran Out of Food in the Last Year: Not on file  Transportation Needs:   . Lack of Transportation (Medical): Not on file  . Lack of Transportation (Non-Medical): Not on file  Physical Activity:   . Days of Exercise per Week: Not on file  . Minutes of Exercise per Session: Not on file  Stress:   . Feeling of Stress : Not on file  Social Connections:   . Frequency of Communication with Friends and Family: Not on file  . Frequency of Social Gatherings with Friends and Family: Not on file  . Attends Religious Services: Not on file  . Active Member of Clubs or Organizations: Not on file  . Attends Archivist Meetings: Not  on file  . Marital Status: Not on file  Intimate Partner Violence:   . Fear of Current or Ex-Partner: Not on file  . Emotionally Abused: Not on file  . Physically Abused: Not on file  . Sexually Abused: Not on file     No Known Allergies   Outpatient Medications Prior to Visit  Medication Sig Dispense Refill  . Ascorbic Acid (VITAMIN C) 1000 MG tablet Take 1,000 mg by mouth daily.      . benzonatate (TESSALON) 200 MG capsule Take 1 capsule (200 mg total) by mouth 3 (three) times daily as needed for cough. 45 capsule 0  . famotidine (PEPCID) 20 MG tablet Take 1 tablet (20 mg total) by mouth 2 (two) times daily. 60 tablet 3  . loratadine  (CLARITIN) 10 MG tablet Take 1 tablet (10 mg total) by mouth daily. 30 tablet 3  . montelukast (SINGULAIR) 10 MG tablet Take 1 tablet (10 mg total) by mouth at bedtime. 30 tablet 3  . omeprazole (PRILOSEC) 20 MG capsule Take 1 capsule (20 mg total) by mouth 2 (two) times daily before a meal. 60 capsule 3  . thyroid (ARMOUR) 60 MG tablet Take 60 mg by mouth daily.      Marland Kitchen thyroid (ARMOUR) 90 MG tablet Take 90 mg by mouth daily.       No facility-administered medications prior to visit.     Objective:  Excellent insight Speaking in full sentences    There were no vitals filed for this visit.   on RA BMI Readings from Last 3 Encounters:  11/17/19 34.49 kg/m   Wt Readings from Last 3 Encounters:  11/17/19 176 lb 9.6 oz (80.1 kg)  CXR benign    Assessment & Plan:  # Chronic nonproductive cough, chest tightness- no benefit to empiric GERD/sinusitis treatment. 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  Esophagram negative for esophageal issues but does show either sequelae of paralyzed vocal cord vs. Less likely mass in left hypopharyngeal region.    - ENT referral for laryngoscopy: Distant hx thyroid cancer (recurrent), persistent cough and chest tightness over past couple months.  Known vocal cord paralysis assuming left.  Recent esophagram abnormal in left hypopharyngeal region, consider for laryngoscopy and further interventions per your discretion.  Injections may be helpful here if nothing concerning is found.  - Given history of recurrent metastatic thyroid cancer (original 1996, recurrence 2008) want to be safe and get CT chest and neck to make sure nothing structural that needs biopsy   . I reviewed prior external note(s) from Dr. Dia Sitter in June 2008 after a redo neck dissection for recurrent metastatic papillary thyroid cancer (original 1996). . I reviewed the result(s) of surgical pathology from  June 2008 showing  metatastatic lymph node involvement on multiple levels without extra-capsular spread.  Esophagram 12/11/19 also reviewed with possible mass effect in left hypopharyngeal area. . I have ordered CT chest and neck to make sure no cancer recurrence as well as referral to ENT  Independent interpretation of tests . Review of patient's Aug 2020 CXR images reveal no pulmonary disease, no obvious distortion of visible trachea. The patient's images have been independently reviewed by me.         Current Outpatient Medications:  .  Ascorbic Acid (VITAMIN C) 1000 MG tablet, Take 1,000 mg by mouth daily.  , Disp: , Rfl:  .  benzonatate (TESSALON) 200 MG capsule, Take 1 capsule (200 mg total) by  mouth 3 (three) times daily as needed for cough., Disp: 45 capsule, Rfl: 0 .  famotidine (PEPCID) 20 MG tablet, Take 1 tablet (20 mg total) by mouth 2 (two) times daily., Disp: 60 tablet, Rfl: 3 .  loratadine (CLARITIN) 10 MG tablet, Take 1 tablet (10 mg total) by mouth daily., Disp: 30 tablet, Rfl: 3 .  montelukast (SINGULAIR) 10 MG tablet, Take 1 tablet (10 mg total) by mouth at bedtime., Disp: 30 tablet, Rfl: 3 .  omeprazole (PRILOSEC) 20 MG capsule, Take 1 capsule (20 mg total) by mouth 2 (two) times daily before a meal., Disp: 60 capsule, Rfl: 3 .  thyroid (ARMOUR) 60 MG tablet, Take 60 mg by mouth daily.  , Disp: , Rfl:  .  thyroid (ARMOUR) 90 MG tablet, Take 90 mg by mouth daily.  , Disp: , Rfl:    Candee Furbish, MD [facility] 12/12/2019 6:44 PM

## 2019-12-15 ENCOUNTER — Other Ambulatory Visit: Payer: Self-pay

## 2019-12-15 ENCOUNTER — Ambulatory Visit (INDEPENDENT_AMBULATORY_CARE_PROVIDER_SITE_OTHER): Payer: Commercial Managed Care - PPO | Admitting: Internal Medicine

## 2019-12-15 DIAGNOSIS — R0789 Other chest pain: Secondary | ICD-10-CM

## 2019-12-15 DIAGNOSIS — Z85850 Personal history of malignant neoplasm of thyroid: Secondary | ICD-10-CM

## 2019-12-15 DIAGNOSIS — R05 Cough: Secondary | ICD-10-CM | POA: Diagnosis not present

## 2019-12-15 DIAGNOSIS — K219 Gastro-esophageal reflux disease without esophagitis: Secondary | ICD-10-CM

## 2019-12-15 DIAGNOSIS — R059 Cough, unspecified: Secondary | ICD-10-CM

## 2019-12-15 DIAGNOSIS — R933 Abnormal findings on diagnostic imaging of other parts of digestive tract: Secondary | ICD-10-CM

## 2019-12-15 NOTE — Patient Instructions (Signed)
I will call after your CT scan.

## 2019-12-18 ENCOUNTER — Other Ambulatory Visit: Payer: Self-pay

## 2019-12-18 ENCOUNTER — Other Ambulatory Visit: Payer: Commercial Managed Care - PPO

## 2019-12-18 ENCOUNTER — Ambulatory Visit (INDEPENDENT_AMBULATORY_CARE_PROVIDER_SITE_OTHER)
Admission: RE | Admit: 2019-12-18 | Discharge: 2019-12-18 | Disposition: A | Discharge status: Home / Self Care | Payer: Commercial Managed Care - PPO | Source: Ambulatory Visit | Attending: Internal Medicine | Admitting: Internal Medicine

## 2019-12-18 ENCOUNTER — Inpatient Hospital Stay: Admission: RE | Admit: 2019-12-18 | Payer: Commercial Managed Care - PPO | Source: Ambulatory Visit

## 2019-12-18 DIAGNOSIS — R05 Cough: Secondary | ICD-10-CM

## 2019-12-18 DIAGNOSIS — Z85850 Personal history of malignant neoplasm of thyroid: Secondary | ICD-10-CM

## 2019-12-18 DIAGNOSIS — R059 Cough, unspecified: Secondary | ICD-10-CM

## 2019-12-18 DIAGNOSIS — R933 Abnormal findings on diagnostic imaging of other parts of digestive tract: Secondary | ICD-10-CM | POA: Diagnosis not present

## 2019-12-18 MED ORDER — IOHEXOL 300 MG/ML  SOLN
80.0000 mL | Freq: Once | INTRAMUSCULAR | Status: AC | PRN
  Administered 2019-12-18: 80 mL via INTRAVENOUS

## 2019-12-21 ENCOUNTER — Telehealth: Payer: Self-pay | Admitting: Internal Medicine

## 2019-12-21 ENCOUNTER — Other Ambulatory Visit: Payer: Self-pay | Admitting: *Deleted

## 2019-12-21 ENCOUNTER — Other Ambulatory Visit: Payer: Commercial Managed Care - PPO

## 2019-12-21 DIAGNOSIS — R918 Other nonspecific abnormal finding of lung field: Secondary | ICD-10-CM

## 2019-12-21 NOTE — Telephone Encounter (Signed)
Called and updated.  CT chest without contrast in 12 months then f/u with me.

## 2019-12-21 NOTE — Telephone Encounter (Signed)
Spoke with Ebony Mahoney and advised that we will send message to Dr Tamala Julian to advise on CT results.  Please advise.

## 2020-02-06 ENCOUNTER — Other Ambulatory Visit: Payer: Self-pay | Admitting: Internal Medicine

## 2020-02-07 ENCOUNTER — Other Ambulatory Visit: Payer: Self-pay | Admitting: Internal Medicine

## 2020-06-01 ENCOUNTER — Other Ambulatory Visit: Payer: Self-pay | Admitting: Obstetrics & Gynecology

## 2020-06-01 DIAGNOSIS — Z1231 Encounter for screening mammogram for malignant neoplasm of breast: Secondary | ICD-10-CM

## 2020-06-16 ENCOUNTER — Ambulatory Visit
Admission: RE | Admit: 2020-06-16 | Discharge: 2020-06-16 | Disposition: A | Discharge status: Home / Self Care | Payer: Commercial Managed Care - PPO | Source: Ambulatory Visit | Attending: Obstetrics & Gynecology | Admitting: Obstetrics & Gynecology

## 2020-06-16 DIAGNOSIS — Z1231 Encounter for screening mammogram for malignant neoplasm of breast: Secondary | ICD-10-CM

## 2020-12-14 ENCOUNTER — Other Ambulatory Visit: Payer: Self-pay

## 2020-12-14 ENCOUNTER — Ambulatory Visit (INDEPENDENT_AMBULATORY_CARE_PROVIDER_SITE_OTHER)
Admission: RE | Admit: 2020-12-14 | Discharge: 2020-12-14 | Disposition: A | Discharge status: Home / Self Care | Payer: Commercial Managed Care - PPO | Source: Ambulatory Visit | Attending: Internal Medicine | Admitting: Internal Medicine

## 2020-12-14 DIAGNOSIS — R918 Other nonspecific abnormal finding of lung field: Secondary | ICD-10-CM

## 2020-12-15 ENCOUNTER — Encounter: Payer: Self-pay | Admitting: Pulmonary Disease

## 2020-12-15 ENCOUNTER — Ambulatory Visit: Payer: Commercial Managed Care - PPO | Admitting: Pulmonary Disease

## 2020-12-15 VITALS — BP 128/84 | HR 75 | Temp 97.9°F | Ht 60.0 in | Wt 168.0 lb

## 2020-12-15 DIAGNOSIS — R911 Solitary pulmonary nodule: Secondary | ICD-10-CM | POA: Diagnosis not present

## 2020-12-15 DIAGNOSIS — Z85850 Personal history of malignant neoplasm of thyroid: Secondary | ICD-10-CM | POA: Diagnosis not present

## 2020-12-15 DIAGNOSIS — R059 Cough, unspecified: Secondary | ICD-10-CM | POA: Diagnosis not present

## 2020-12-15 NOTE — Patient Instructions (Addendum)
Nice to meet you today!  The small nodule in the periphery of your left lower lung looks stable to my eye compared to 1 year ago.  Based on the stability and per Fleischner recommendations, I do not recommend any ongoing follow-up for this nodule.  If the radiologist reading the scan find something that we need to follow-up on I will make sure to give you a call.  If if any new concerns or symptoms arise I am happy to see you at any time.  If I can be of assistance to any of your other physicians please let me know.  I am glad the cough is better.  It seems like cutting back on caffeine has been the difference maker.  Return to clinic as needed

## 2020-12-15 NOTE — Progress Notes (Signed)
Visit done by telephone to reduce exposure to COVID with permission obtained from patient.  Synopsis: Referred in Jan 2021 for cough by Lujean Amel, MD  Subjective:   PATIENT ID: Ebony Mahoney GENDER: female DOB: 07-04-1971, MRN: [id number]  Chief Complaint  Patient presents with  . Follow-up    Cough Better since last visit.     HPI  50 year old with history of thyroid cancer status post multiple surgeries most recently May 2000 whom we are seeing in follow-up for left lower lobe nodule, cough.  States cough is much improved.  Not totally gone.  But markedly improved in terms of severity and frequency.  Degree of cough that remains is acceptable per her report.  No chest tightness or wheeze per today.  Reviewed at length CT scan of chest was obtained yesterday.  This is a follow-up of CT scan 11/2019 which demonstrated approximate 4 mm left lower lobe nodule in the periphery.  Reviewed both the 2021 scan and scan from yesterday which on my interpretation shows stable left lower lobe nodule in the periphery measuring right at 4 mm, no other parental abnormality, no other nodules noted on my review.  Notably, radiology read this not back at this time.  Discussed at length rationale for following nodules.  Discussed the rationale for the CT scan was obtained yesterday which was optimal per Fleischner criteria given less than 6 mm single nodule in a high risk patient.  Given stability or unchanged nature of the nodule, recommend no further follow-up which was explained.  ROS N/A  Past Medical History:  Diagnosis Date  . Thyroid carcinoma ([place])      Family History  Problem Relation Age of Onset  . Cancer Mother   . Aneurysm Father   . Asthma Sister      Past Surgical History:  Procedure Laterality Date  . CHOLECYSTECTOMY      Social History   Socioeconomic History  . Marital status: Single    Spouse name: Not on file  . Number of children: Not on file  . Years of  education: Not on file  . Highest education level: Not on file  Occupational History  . Not on file  Tobacco Use  . Smoking status: Never Smoker  . Smokeless tobacco: Never Used  Vaping Use  . Vaping Use: Never used  Substance and Sexual Activity  . Alcohol use: Not Currently    Comment: Rare  . Drug use: Never  . Sexual activity: Not on file  Other Topics Concern  . Not on file  Social History Narrative  . Not on file   Social Determinants of Health   Financial Resource Strain: Not on file  Food Insecurity: Not on file  Transportation Needs: Not on file  Physical Activity: Not on file  Stress: Not on file  Social Connections: Not on file  Intimate Partner Violence: Not on file     No Known Allergies   Outpatient Medications Prior to Visit  Medication Sig Dispense Refill  . Ascorbic Acid (VITAMIN C) 1000 MG tablet Take 1,000 mg by mouth daily.    . benzonatate (TESSALON) 200 MG capsule Take 1 capsule (200 mg total) by mouth 3 (three) times daily as needed for cough. 45 capsule 0  . famotidine (PEPCID) 20 MG tablet TAKE 1 TABLET BY MOUTH TWICE A DAY 180 tablet 5  . loratadine (CLARITIN) 10 MG tablet TAKE 1 TABLET BY MOUTH EVERY DAY 90 tablet 5  . montelukast (SINGULAIR)  10 MG tablet TAKE 1 TABLET BY MOUTH EVERYDAY AT BEDTIME 90 tablet 5  . thyroid (ARMOUR) 60 MG tablet Take 60 mg by mouth daily.    Marland Kitchen thyroid (ARMOUR) 90 MG tablet Take 90 mg by mouth daily.    Marland Kitchen omeprazole (PRILOSEC) 20 MG capsule TAKE 1 CAPSULE (20 MG TOTAL) BY MOUTH 2 (TWO) TIMES DAILY BEFORE A MEAL. (Patient not taking: Reported on 12/15/2020) 180 capsule 5   No facility-administered medications prior to visit.     Objective:   Vitals:   12/15/20 1422  BP: 128/84  Pulse: 75  Temp: 97.9 F (36.6 C)  SpO2: 100%  Weight: 168 lb (76.2 kg)  Height: 5' (1.524 m)   100% on RA BMI Readings from Last 3 Encounters:  12/15/20 32.81 kg/m  11/17/19 34.49 kg/m   Wt Readings from Last 3 Encounters:   12/15/20 168 lb (76.2 kg)  11/17/19 176 lb 9.6 oz (80.1 kg)   General: Well-appearing, no acute distress Eyes: EOMI, icterus Neck: Supple, midline horizontal surgical scar just above manubrium Pulmonary: Normal work of breathing, on room air Cardiovascular: Warm, no edema noted Abdomen: Nondistended, flat MSK: No synovitis, joint effusion appreciated Neuro: No weakness, normal gait Psych: Normal mood, full affect    Assessment & Plan:  Chronic nonproductive cough: no benefit to empiric GERD/sinusitis treatment. Known paralyzed vocal cord possibly contributing.  However, she attributes improvement in cough which is now markedly improved on Anoro but not totally gone to reduction in caffeine intake.  Frequency and severity of cough are currently acceptable at this point time.  No further work-up or therapeutic interventions for now.  Left lower lobe pulmonary nodule: Middle CT scan 11/2019.  Approximate 4 mm on my measurement.  High risk and history of thyroid cancer.  Repeat CT scan obtained 12/14/2020 to my eye demonstrate stability, unchanged left lower lobe nodule measuring right at 4 mm on my measurement.  Do not see any other significant pulmonary nodules or parenchymal abnormality.  Awaiting radiology read.  Per Fleischner criteria, no further follow-up for this nodule.    Current Outpatient Medications:  .  Ascorbic Acid (VITAMIN C) 1000 MG tablet, Take 1,000 mg by mouth daily., Disp: , Rfl:  .  benzonatate (TESSALON) 200 MG capsule, Take 1 capsule (200 mg total) by mouth 3 (three) times daily as needed for cough., Disp: 45 capsule, Rfl: 0 .  famotidine (PEPCID) 20 MG tablet, TAKE 1 TABLET BY MOUTH TWICE A DAY, Disp: 180 tablet, Rfl: 5 .  loratadine (CLARITIN) 10 MG tablet, TAKE 1 TABLET BY MOUTH EVERY DAY, Disp: 90 tablet, Rfl: 5 .  montelukast (SINGULAIR) 10 MG tablet, TAKE 1 TABLET BY MOUTH EVERYDAY AT BEDTIME, Disp: 90 tablet, Rfl: 5 .  thyroid (ARMOUR) 60 MG tablet, Take 60 mg by  mouth daily., Disp: , Rfl:  .  thyroid (ARMOUR) 90 MG tablet, Take 90 mg by mouth daily., Disp: , Rfl:  .  omeprazole (PRILOSEC) 20 MG capsule, TAKE 1 CAPSULE (20 MG TOTAL) BY MOUTH 2 (TWO) TIMES DAILY BEFORE A MEAL. (Patient not taking: Reported on 12/15/2020), Disp: 180 capsule, Rfl: 5   Lanier Clam, MD [facility] 12/15/2020 2:55 PM   I spent 50 minutes in care of the patient for this visit including face-to-face visit, review of extensive prior medical records and images, labs, results etc., coordination of care.

## 2021-03-01 IMAGING — CT CT CHEST W/ CM
5 of 10 series · 14 of 36 positions shown, 16 images · IV contrast (ISOVUE 300)
Comparison: None.

CLINICAL DATA: Persistent sore throat and cough. History of thyroid
cancer with thyroidectomy in 5110.

EXAM:
CT CHEST WITH CONTRAST
TECHNIQUE: Multidetector CT imaging of the chest was performed during
intravenous contrast administration.
CONTRAST:  80mL OMNIPAQUE IOHEXOL 300 MG/ML  SOLN

[Series 3: chest w/cm 5.0 bf37 1 · axial · 0.72mm/px · z∈[-433,-333]mm · 2 of 61 slices shown]
[im 21/61  lung]
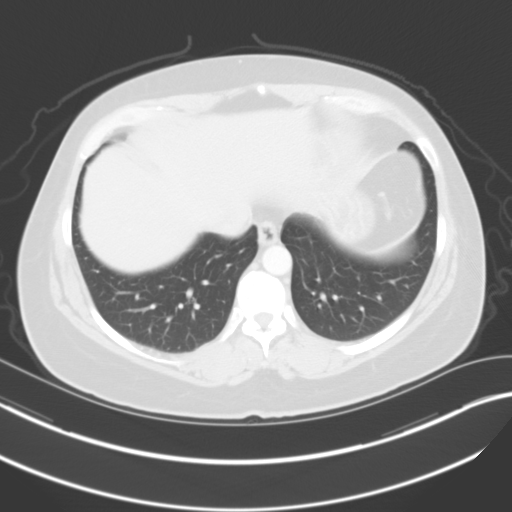
[im 41/61  lung]
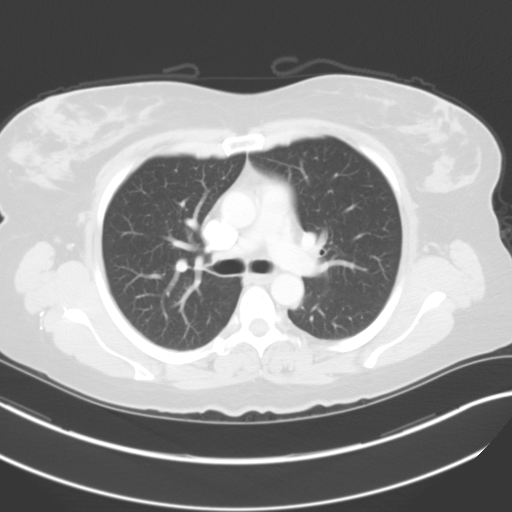

[Series 4: lungs · axial · 0.72mm/px · z∈[-483,-283]mm · 5 of 151 slices shown, 7 images]
[im 26/151  mediastinal]
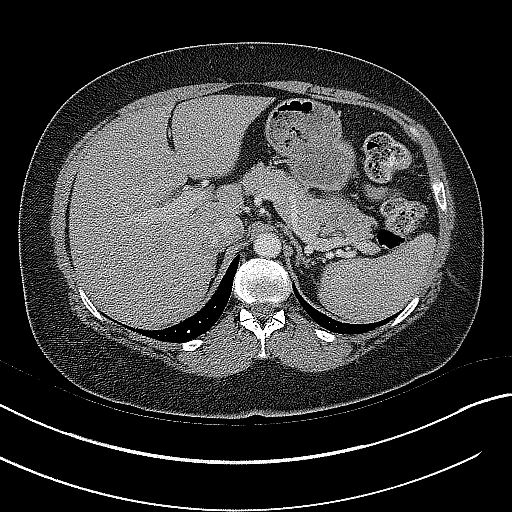
[im 26/151  lung]
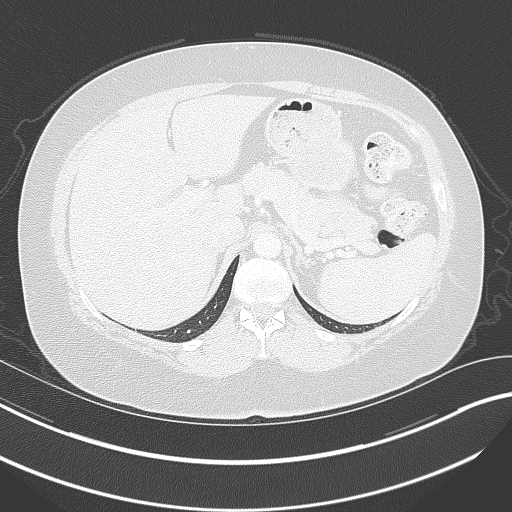
[im 51/151  lung]
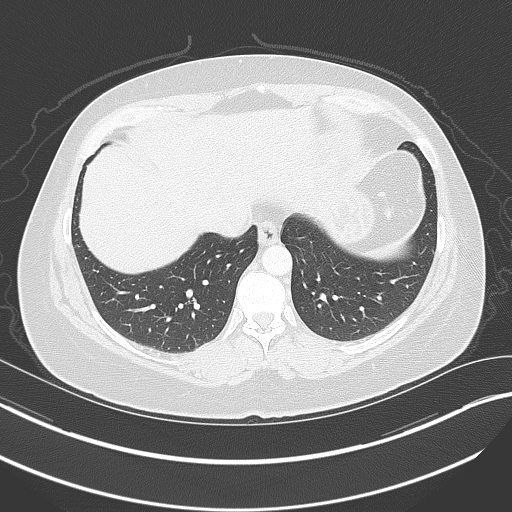
[im 76/151  lung]
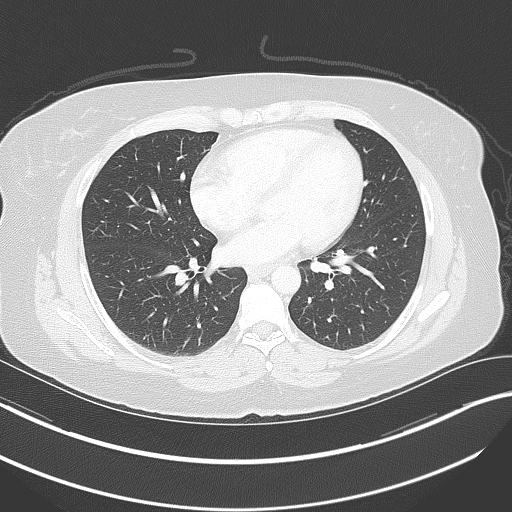
[im 101/151  lung]
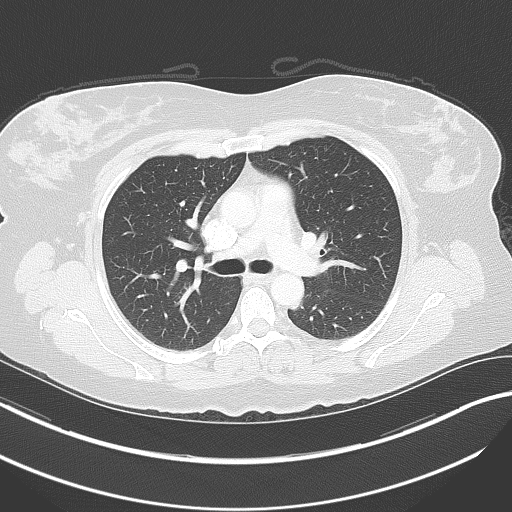
[im 126/151  mediastinal]
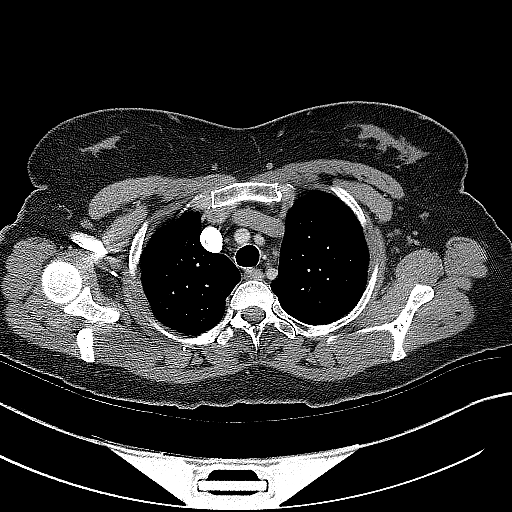
[im 126/151  lung]
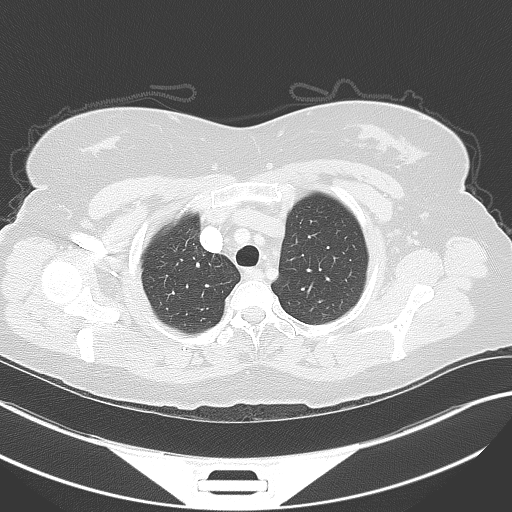

[Series 6: coronal · coronal · 0.59mm/px · 1 of 75 slices shown]
[im 38/75  lung]
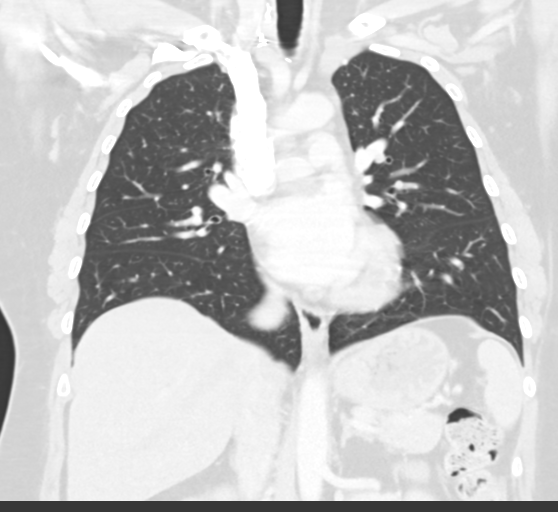

[Series 8: neck 2.0 bf37 2 · axial · 0.41mm/px · z∈[-274,-170]mm · 3 of 105 slices shown]
[im 27/105  lung]
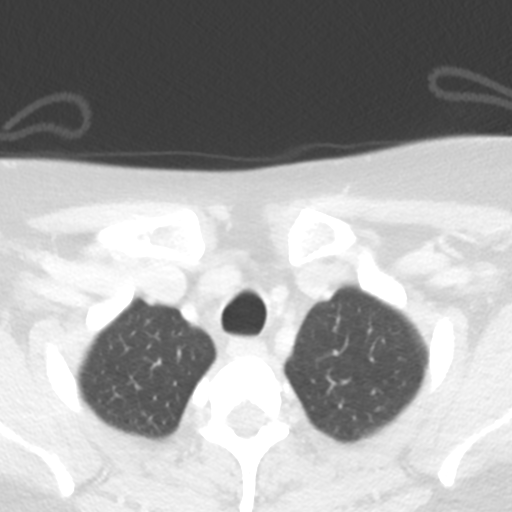
[im 53/105  lung]
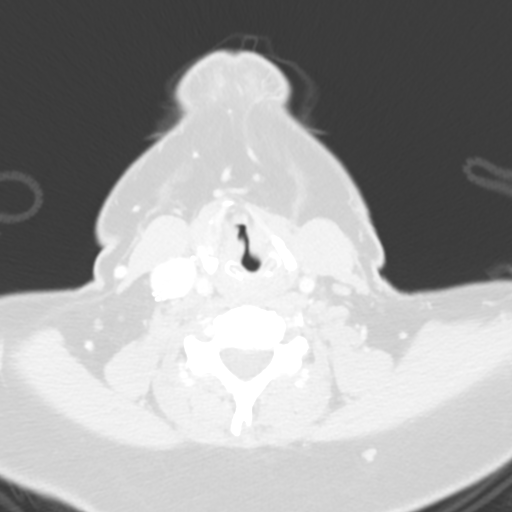
[im 79/105  lung]
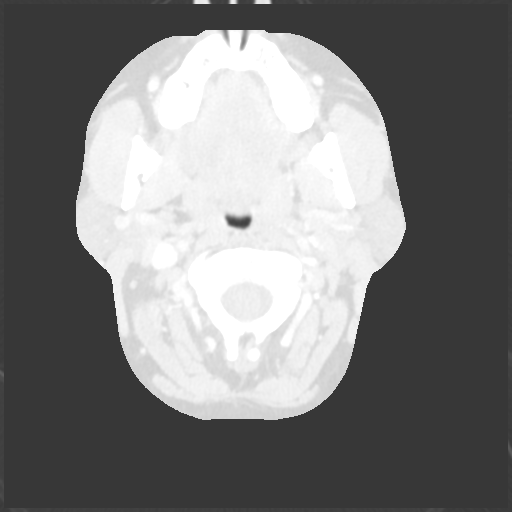

[Series 11: neck 2.0 br62 bone · axial · 0.42mm/px · z∈[-274,-170]mm · 3 of 105 slices shown]
[im 27/105  lung]
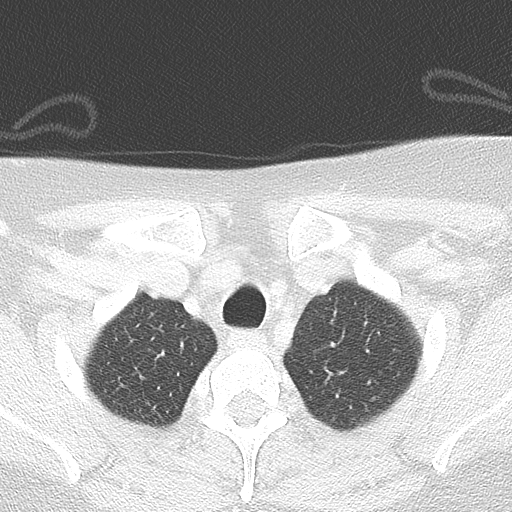
[im 53/105  lung]
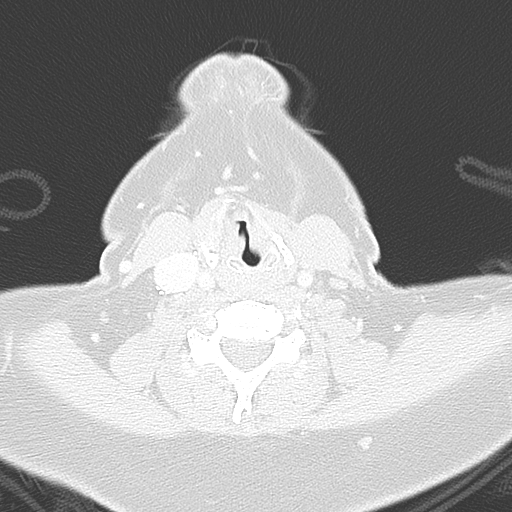
[im 79/105  lung]
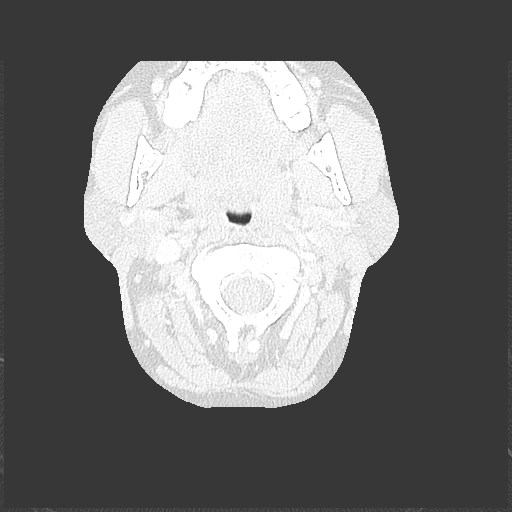

[14 of 36 positions shown; findings below may reference images not displayed]

FINDINGS: Cardiovascular: The heart size is normal. There is no pericardial
effusion. No evidence for thoracic aortic aneurysm or dissection. No
large centrally located pulmonary embolism.

Mediastinum/Nodes:

--No mediastinal or hilar lymphadenopathy.

--No axillary lymphadenopathy.

--No supraclavicular lymphadenopathy.

--the patient appears to be status post total thyroidectomy. There
is no obvious soft tissue within the thyroidectomy bed.

--the esophagus is unremarkable. There is no CT finding to explain
the appearance of the recent esophagram.

Lungs/Pleura: There is a 4 mm pulmonary nodule in the peripheral
left lower lobe (axial series 4, image 95). There is no
pneumothorax. No large pleural effusion.

Upper Abdomen: Patient is status post prior cholecystectomy.

Musculoskeletal: No chest wall abnormality. No acute or significant
osseous findings.
IMPRESSION: 1. No acute abnormality.
2. No findings to explain the patient's symptoms. No CT abnormality
to explain the finding on recent esophagram.
3. There is a 4 mm pulmonary nodule in the left lower lobe. Given
the patient's history of thyroid cancer, a 12 month follow-up CT is
recommended.
4. Status post total thyroidectomy. No obvious residual thyroid
tissue identified on today's exam.

## 2021-06-19 ENCOUNTER — Other Ambulatory Visit: Payer: Self-pay | Admitting: Family Medicine

## 2021-06-19 DIAGNOSIS — Z1231 Encounter for screening mammogram for malignant neoplasm of breast: Secondary | ICD-10-CM

## 2021-06-20 ENCOUNTER — Other Ambulatory Visit: Payer: Self-pay

## 2021-06-20 ENCOUNTER — Telehealth: Payer: Self-pay | Admitting: Obstetrics & Gynecology

## 2021-06-20 NOTE — Telephone Encounter (Signed)
Patient has an appointment on 07/10/2021 with Dr Nelda Marseille for her pap/physical. She will be running out of her birth control pills, Lorissa?, on 07/02/21. She wanted to know if a refill could be called in to get her through til her next appointment. She uses [facility] in [place], [place]. She said she may not be able to answer phone if you call her at work, but you can leave a message on her voicemail.

## 2021-06-23 MED ORDER — LEVONORGESTREL-ETHINYL ESTRAD 0.1-20 MG-MCG PO TABS: 1.0000 | ORAL_TABLET | Freq: Every day | ORAL | 2 refills | Status: DC

## 2021-07-10 ENCOUNTER — Encounter: Payer: Self-pay | Admitting: Obstetrics & Gynecology

## 2021-07-10 ENCOUNTER — Other Ambulatory Visit: Payer: Self-pay

## 2021-07-10 ENCOUNTER — Ambulatory Visit (INDEPENDENT_AMBULATORY_CARE_PROVIDER_SITE_OTHER): Payer: Commercial Managed Care - PPO | Admitting: Obstetrics & Gynecology

## 2021-07-10 VITALS — BP 120/75 | HR 78 | Ht 60.0 in | Wt 181.2 lb

## 2021-07-10 DIAGNOSIS — Z01419 Encounter for gynecological examination (general) (routine) without abnormal findings: Secondary | ICD-10-CM

## 2021-07-10 DIAGNOSIS — Z3041 Encounter for surveillance of contraceptive pills: Secondary | ICD-10-CM | POA: Diagnosis not present

## 2021-07-10 MED ORDER — LEVONORGESTREL-ETHINYL ESTRAD 0.1-20 MG-MCG PO TABS
1.0000 | ORAL_TABLET | Freq: Every day | ORAL | 4 refills | Status: DC
Start: 2021-07-10 — End: 2022-08-31

## 2021-07-10 NOTE — Progress Notes (Signed)
   WELL-WOMAN EXAMINATION Patient name: Ebony Mahoney MRN [id number]  Date of birth: 1971/03/01 Chief Complaint:   Gynecologic Exam (Last pap 06/10/20)  History of Present Illness:   Ebony Mahoney is a 50 y.o. G1P1 female being seen today for a routine well-woman exam.   Today she notes the only change is hair thinning- also followed by endocrinologist- recent increase in thyroid medication.  -Last visit with Ebony Mahoney 05/2020  -annual- on OCPs to regulate menses- EMB neg (08/2014) was considering ablation  -Pap completed   Patient's last menstrual period was 07/07/2021. Denies issues with her menses The current method of family planning is OCP (estrogen/progesterone).    Last pap 06/10/2020- Pap/HPV neg.  Last mammogram: 05/2020- already scheduled Last colonoscopy: completed per pt 2021  Depression screen [facility] 2/9 07/10/2021  Decreased Interest 0  Down, Depressed, Hopeless 0  PHQ - 2 Score 0  Altered sleeping 0  Tired, decreased energy 0  Change in appetite 0  Feeling bad or failure about yourself  0  Trouble concentrating 0  Moving slowly or fidgety/restless 0  Suicidal thoughts 0  PHQ-9 Score 0      Review of Systems:   Pertinent items are noted in HPI Denies any headaches, blurred vision, fatigue, shortness of breath, chest pain, abdominal pain, bowel movements, urination, or intercourse unless otherwise stated above.  Pertinent History Reviewed:  Reviewed past medical,surgical, social and family history.  Reviewed problem list, medications and allergies. Physical Assessment:   Vitals:   07/10/21 0939  BP: 120/75  Pulse: 78  Weight: 181 lb 3.2 oz (82.2 kg)  Height: 5' (1.524 m)  Body mass index is 35.39 kg/m.        Physical Examination:   General appearance - well appearing, and in no distress  Mental status - alert, oriented to person, place, and time  Psych:  She has a normal mood and affect  Skin - warm and dry, normal color, no suspicious lesions  noted  Chest - effort normal, all lung fields clear to auscultation bilaterally  Heart - normal rate and regular rhythm  Neck:  midline trachea, no masses or nodules  Breasts - breasts appear normal, no suspicious masses, no skin or nipple changes or  axillary nodes  Abdomen - soft, nontender, nondistended, no masses or organomegaly, small umbilical hernia noted  Pelvic - VULVA: normal appearing vulva with no masses, tenderness or lesions  VAGINA: normal appearing vagina with normal color and discharge, no lesions  CERVIX: normal appearing cervix without discharge or lesions, no CMT  Pap not collected  UTERUS: uterus is felt to be normal size, shape, consistency and nontender   ADNEXA: No adnexal masses or tenderness noted.  Extremities:  No swelling or varicosities noted  Chaperone:  declined      Assessment & Plan:  1) Well-Woman Exam -pap up to date -mammogram scheduled for later this week -colonoscopy completed 2021  -doing well with OCPs, plan to continue May consider stopping if recommended by endocrine regarding hair thinning though did not think pill was impacting this concern  No orders of the defined types were placed in this encounter.   Meds: No orders of the defined types were placed in this encounter.   Follow-up: Return in about 1 year (around 07/10/2022) for Annual, with Dr. Nelda Marseille.   Ebony Pupa, DO Attending [place], [facility] for [employer], [place]

## 2021-07-12 ENCOUNTER — Other Ambulatory Visit: Payer: Self-pay

## 2021-07-12 ENCOUNTER — Ambulatory Visit
Admission: RE | Admit: 2021-07-12 | Discharge: 2021-07-12 | Disposition: A | Discharge status: Home / Self Care | Payer: Commercial Managed Care - PPO | Source: Ambulatory Visit | Attending: Family Medicine | Admitting: Family Medicine

## 2021-07-12 DIAGNOSIS — Z1231 Encounter for screening mammogram for malignant neoplasm of breast: Secondary | ICD-10-CM

## 2021-07-18 ENCOUNTER — Other Ambulatory Visit: Payer: Self-pay | Admitting: Family Medicine

## 2021-07-18 DIAGNOSIS — R928 Other abnormal and inconclusive findings on diagnostic imaging of breast: Secondary | ICD-10-CM

## 2021-08-02 ENCOUNTER — Other Ambulatory Visit: Payer: Commercial Managed Care - PPO

## 2021-08-22 ENCOUNTER — Ambulatory Visit
Admission: RE | Admit: 2021-08-22 | Discharge: 2021-08-22 | Disposition: A | Discharge status: Home / Self Care | Payer: Commercial Managed Care - PPO | Source: Ambulatory Visit | Attending: Family Medicine | Admitting: Family Medicine

## 2021-08-22 ENCOUNTER — Ambulatory Visit: Payer: Commercial Managed Care - PPO

## 2021-08-22 ENCOUNTER — Other Ambulatory Visit: Payer: Self-pay

## 2021-08-22 DIAGNOSIS — R928 Other abnormal and inconclusive findings on diagnostic imaging of breast: Secondary | ICD-10-CM

## 2022-06-05 ENCOUNTER — Other Ambulatory Visit: Payer: Self-pay | Admitting: Family Medicine

## 2022-06-05 ENCOUNTER — Ambulatory Visit
Admission: RE | Admit: 2022-06-05 | Discharge: 2022-06-05 | Disposition: A | Discharge status: Home / Self Care | Payer: Commercial Managed Care - PPO | Source: Ambulatory Visit | Attending: Family Medicine | Admitting: Family Medicine

## 2022-06-05 DIAGNOSIS — M25532 Pain in left wrist: Secondary | ICD-10-CM

## 2022-06-05 DIAGNOSIS — M79645 Pain in left finger(s): Secondary | ICD-10-CM

## 2022-07-12 ENCOUNTER — Other Ambulatory Visit: Payer: Self-pay | Admitting: Family Medicine

## 2022-07-12 DIAGNOSIS — Z1231 Encounter for screening mammogram for malignant neoplasm of breast: Secondary | ICD-10-CM

## 2022-08-01 ENCOUNTER — Other Ambulatory Visit (HOSPITAL_BASED_OUTPATIENT_CLINIC_OR_DEPARTMENT_OTHER): Payer: Self-pay

## 2022-08-01 ENCOUNTER — Encounter (HOSPITAL_BASED_OUTPATIENT_CLINIC_OR_DEPARTMENT_OTHER): Payer: Self-pay | Admitting: Pharmacist

## 2022-08-01 MED ORDER — WEGOVY 0.25 MG/0.5ML ~~LOC~~ SOAJ
SUBCUTANEOUS | 0 refills | Status: AC
  Filled 2022-08-01 – 2022-11-28 (×3): qty 2, 28d supply, fill #0

## 2022-08-07 ENCOUNTER — Ambulatory Visit: Payer: Commercial Managed Care - PPO | Admitting: Obstetrics & Gynecology

## 2022-08-16 ENCOUNTER — Other Ambulatory Visit (HOSPITAL_BASED_OUTPATIENT_CLINIC_OR_DEPARTMENT_OTHER): Payer: Self-pay

## 2022-08-22 ENCOUNTER — Other Ambulatory Visit (HOSPITAL_BASED_OUTPATIENT_CLINIC_OR_DEPARTMENT_OTHER): Payer: Self-pay

## 2022-08-23 ENCOUNTER — Ambulatory Visit
Admission: RE | Admit: 2022-08-23 | Discharge: 2022-08-23 | Disposition: A | Discharge status: Home / Self Care | Payer: Commercial Managed Care - PPO | Source: Ambulatory Visit | Attending: Family Medicine | Admitting: Family Medicine

## 2022-08-23 DIAGNOSIS — Z1231 Encounter for screening mammogram for malignant neoplasm of breast: Secondary | ICD-10-CM

## 2022-08-28 ENCOUNTER — Other Ambulatory Visit (HOSPITAL_BASED_OUTPATIENT_CLINIC_OR_DEPARTMENT_OTHER): Payer: Self-pay

## 2022-08-31 ENCOUNTER — Other Ambulatory Visit (HOSPITAL_COMMUNITY)
Admission: RE | Admit: 2022-08-31 | Discharge: 2022-08-31 | Disposition: A | Discharge status: Home / Self Care | Payer: Commercial Managed Care - PPO | Source: Ambulatory Visit | Attending: Obstetrics & Gynecology | Admitting: Obstetrics & Gynecology

## 2022-08-31 ENCOUNTER — Ambulatory Visit (INDEPENDENT_AMBULATORY_CARE_PROVIDER_SITE_OTHER): Payer: Commercial Managed Care - PPO | Admitting: Obstetrics & Gynecology

## 2022-08-31 ENCOUNTER — Encounter: Payer: Self-pay | Admitting: Obstetrics & Gynecology

## 2022-08-31 VITALS — BP 124/70 | HR 71 | Ht 60.0 in | Wt 169.2 lb

## 2022-08-31 DIAGNOSIS — Z01419 Encounter for gynecological examination (general) (routine) without abnormal findings: Secondary | ICD-10-CM

## 2022-08-31 NOTE — Progress Notes (Signed)
   [place] EXAMINATION Patient name: Ebony Mahoney MRN [id number]  Date of birth: November 29, 1970 Chief Complaint:   Gynecologic Exam  History of Present Illness:   Ebony Mahoney is a 51 y.o. G61P1001 female being seen today for a routine well-woman exam.    Recently stopped OCPs and has not really noticed any difference so stopped completely. Seldom having hot flashes.  Menses are very light, typically each month and only last for a few days.  Notes decreased libido.  No other acute complaints  Patient's last menstrual period was 08/17/2022 (approximate).  The current method of family planning is  perimenopause .    Last pap 2021.  Last mammogram: 2023. Last colonoscopy: 2021     07/10/2021    9:38 AM  Depression screen PHQ 2/9  Decreased Interest 0  Down, Depressed, Hopeless 0  PHQ - 2 Score 0  Altered sleeping 0  Tired, decreased energy 0  Change in appetite 0  Feeling bad or failure about yourself  0  Trouble concentrating 0  Moving slowly or fidgety/restless 0  Suicidal thoughts 0  PHQ-9 Score 0      Review of Systems:   Pertinent items are noted in HPI Denies any headaches, blurred vision, fatigue, shortness of breath, chest pain, abdominal pain, bowel movements, urination, or intercourse unless otherwise stated above.  Pertinent History Reviewed:  Reviewed past medical,surgical, social and family history.  Reviewed problem list, medications and allergies. Physical Assessment:   Vitals:   08/31/22 0924  BP: 124/70  Pulse: 71  Weight: 169 lb 3.2 oz (76.7 kg)  Height: 5' (1.524 m)  Body mass index is 33.04 kg/m.        Physical Examination:   General appearance - well appearing, and in no distress  Mental status - alert, oriented to person, place, and time  Psych:  She has a normal mood and affect  Skin - warm and dry, normal color, no suspicious lesions noted  Chest - effort normal, all lung fields clear to auscultation bilaterally  Heart - normal  rate and regular rhythm  Neck:  midline trachea, no thyromegaly or nodules  Breasts - breasts appear normal, no suspicious masses, no skin or nipple changes or  axillary nodes  Abdomen - soft, nontender, nondistended, no masses or organomegaly  Pelvic - VULVA: normal appearing vulva with no masses, tenderness or lesions  VAGINA: normal appearing vagina with normal color and discharge, no lesions  CERVIX: normal appearing cervix without discharge or lesions, no CMT  Thin prep pap is done with HR HPV cotesting  UTERUS: uterus is felt to be normal size, shape, consistency and nontender   ADNEXA: No adnexal masses or tenderness noted.  Extremities:  No swelling or varicosities noted  Chaperone:  pt declined      Assessment & Plan:  1) Well-Woman Exam -pap collected, reviewed screening guidelines -other preventive screening up to date  2) Decreased libido -reviewed options including Addyi, herbal supplements or testosterone -pt to monitor symptoms for now and let us know her plan   Meds: No orders of the defined types were placed in this encounter.   Follow-up: Return in about 1 year (around 09/01/2023) for Annual, with Dr. Nelda Marseille.   Janyth Pupa, DO Attending [place], [facility] for [employer], [place]

## 2022-09-05 LAB — CYTOLOGY - PAP
Adequacy: ABSENT
Comment: NEGATIVE
Diagnosis: NEGATIVE
High risk HPV: NEGATIVE

## 2022-10-07 ENCOUNTER — Other Ambulatory Visit (HOSPITAL_BASED_OUTPATIENT_CLINIC_OR_DEPARTMENT_OTHER): Payer: Self-pay

## 2022-10-24 ENCOUNTER — Other Ambulatory Visit (HOSPITAL_BASED_OUTPATIENT_CLINIC_OR_DEPARTMENT_OTHER): Payer: Self-pay

## 2022-11-02 ENCOUNTER — Other Ambulatory Visit (HOSPITAL_BASED_OUTPATIENT_CLINIC_OR_DEPARTMENT_OTHER): Payer: Self-pay

## 2022-11-03 ENCOUNTER — Other Ambulatory Visit (HOSPITAL_BASED_OUTPATIENT_CLINIC_OR_DEPARTMENT_OTHER): Payer: Self-pay

## 2022-11-10 ENCOUNTER — Other Ambulatory Visit (HOSPITAL_BASED_OUTPATIENT_CLINIC_OR_DEPARTMENT_OTHER): Payer: Self-pay

## 2022-11-14 ENCOUNTER — Other Ambulatory Visit (HOSPITAL_BASED_OUTPATIENT_CLINIC_OR_DEPARTMENT_OTHER): Payer: Self-pay

## 2022-11-15 ENCOUNTER — Other Ambulatory Visit (HOSPITAL_BASED_OUTPATIENT_CLINIC_OR_DEPARTMENT_OTHER): Payer: Self-pay

## 2022-11-16 ENCOUNTER — Other Ambulatory Visit (HOSPITAL_COMMUNITY): Payer: Self-pay

## 2022-11-16 ENCOUNTER — Other Ambulatory Visit (HOSPITAL_BASED_OUTPATIENT_CLINIC_OR_DEPARTMENT_OTHER): Payer: Self-pay

## 2022-11-18 ENCOUNTER — Other Ambulatory Visit (HOSPITAL_BASED_OUTPATIENT_CLINIC_OR_DEPARTMENT_OTHER): Payer: Self-pay

## 2022-11-19 ENCOUNTER — Other Ambulatory Visit (HOSPITAL_BASED_OUTPATIENT_CLINIC_OR_DEPARTMENT_OTHER): Payer: Self-pay

## 2022-11-20 ENCOUNTER — Other Ambulatory Visit (HOSPITAL_BASED_OUTPATIENT_CLINIC_OR_DEPARTMENT_OTHER): Payer: Self-pay

## 2022-11-28 ENCOUNTER — Other Ambulatory Visit (HOSPITAL_BASED_OUTPATIENT_CLINIC_OR_DEPARTMENT_OTHER): Payer: Self-pay

## 2022-12-03 ENCOUNTER — Other Ambulatory Visit (HOSPITAL_BASED_OUTPATIENT_CLINIC_OR_DEPARTMENT_OTHER): Payer: Self-pay

## 2022-12-03 MED ORDER — WEGOVY 0.5 MG/0.5ML ~~LOC~~ SOAJ
0.5000 mg | SUBCUTANEOUS | 0 refills | Status: DC
  Filled 2022-12-03: qty 2, 28d supply, fill #0

## 2022-12-07 ENCOUNTER — Other Ambulatory Visit (HOSPITAL_BASED_OUTPATIENT_CLINIC_OR_DEPARTMENT_OTHER): Payer: Self-pay

## 2022-12-10 ENCOUNTER — Other Ambulatory Visit: Payer: Self-pay

## 2023-01-10 ENCOUNTER — Other Ambulatory Visit (HOSPITAL_BASED_OUTPATIENT_CLINIC_OR_DEPARTMENT_OTHER): Payer: Self-pay

## 2023-01-10 MED ORDER — WEGOVY 0.5 MG/0.5ML ~~LOC~~ SOAJ
0.5000 mL | SUBCUTANEOUS | 1 refills | Status: AC
  Filled 2023-01-10 – 2023-01-16 (×2): qty 2, 28d supply, fill #0

## 2023-01-12 ENCOUNTER — Other Ambulatory Visit (HOSPITAL_BASED_OUTPATIENT_CLINIC_OR_DEPARTMENT_OTHER): Payer: Self-pay

## 2023-01-16 ENCOUNTER — Other Ambulatory Visit (HOSPITAL_BASED_OUTPATIENT_CLINIC_OR_DEPARTMENT_OTHER): Payer: Self-pay

## 2023-01-18 ENCOUNTER — Other Ambulatory Visit (HOSPITAL_BASED_OUTPATIENT_CLINIC_OR_DEPARTMENT_OTHER): Payer: Self-pay

## 2023-01-22 ENCOUNTER — Other Ambulatory Visit (HOSPITAL_BASED_OUTPATIENT_CLINIC_OR_DEPARTMENT_OTHER): Payer: Self-pay

## 2023-01-22 ENCOUNTER — Other Ambulatory Visit: Payer: Self-pay

## 2023-01-24 ENCOUNTER — Other Ambulatory Visit: Payer: Self-pay

## 2023-01-24 ENCOUNTER — Other Ambulatory Visit (HOSPITAL_COMMUNITY): Payer: Self-pay

## 2023-01-24 ENCOUNTER — Other Ambulatory Visit (HOSPITAL_BASED_OUTPATIENT_CLINIC_OR_DEPARTMENT_OTHER): Payer: Self-pay

## 2023-01-24 MED ORDER — WEGOVY 1 MG/0.5ML ~~LOC~~ SOAJ
1.0000 mg | SUBCUTANEOUS | 1 refills | Status: DC
  Filled 2023-01-24: qty 2, 28d supply, fill #0

## 2023-02-11 ENCOUNTER — Other Ambulatory Visit (HOSPITAL_BASED_OUTPATIENT_CLINIC_OR_DEPARTMENT_OTHER): Payer: Self-pay

## 2023-02-11 MED ORDER — WEGOVY 1 MG/0.5ML ~~LOC~~ SOAJ
1.0000 mg | SUBCUTANEOUS | 3 refills | Status: AC
  Filled 2023-02-11 – 2023-02-16 (×2): qty 2, 28d supply, fill #0

## 2023-02-16 ENCOUNTER — Other Ambulatory Visit (HOSPITAL_BASED_OUTPATIENT_CLINIC_OR_DEPARTMENT_OTHER): Payer: Self-pay

## 2023-02-18 ENCOUNTER — Other Ambulatory Visit (HOSPITAL_BASED_OUTPATIENT_CLINIC_OR_DEPARTMENT_OTHER): Payer: Self-pay

## 2023-02-18 MED ORDER — WEGOVY 1 MG/0.5ML ~~LOC~~ SOAJ
1.0000 mg | SUBCUTANEOUS | 3 refills | Status: DC
  Filled 2023-02-18: qty 2, 28d supply, fill #0

## 2023-02-18 MED ORDER — WEGOVY 1.7 MG/0.75ML ~~LOC~~ SOAJ
1.7000 mg | SUBCUTANEOUS | 1 refills | Status: DC
  Filled 2023-02-18: qty 3, 28d supply, fill #0
  Filled 2023-08-17: qty 3, 28d supply, fill #1

## 2023-03-05 ENCOUNTER — Other Ambulatory Visit (HOSPITAL_BASED_OUTPATIENT_CLINIC_OR_DEPARTMENT_OTHER): Payer: Self-pay

## 2023-03-05 MED ORDER — LIOTHYRONINE SODIUM 5 MCG PO TABS
5.0000 ug | ORAL_TABLET | Freq: Two times a day (BID) | ORAL | 0 refills | Status: DC
  Filled 2023-03-05: qty 180, 90d supply, fill #0

## 2023-03-06 ENCOUNTER — Other Ambulatory Visit (HOSPITAL_BASED_OUTPATIENT_CLINIC_OR_DEPARTMENT_OTHER): Payer: Self-pay

## 2023-03-07 ENCOUNTER — Other Ambulatory Visit (HOSPITAL_BASED_OUTPATIENT_CLINIC_OR_DEPARTMENT_OTHER): Payer: Self-pay

## 2023-03-11 ENCOUNTER — Other Ambulatory Visit (HOSPITAL_BASED_OUTPATIENT_CLINIC_OR_DEPARTMENT_OTHER): Payer: Self-pay

## 2023-03-11 MED ORDER — WEGOVY 2.4 MG/0.75ML ~~LOC~~ SOAJ
2.4000 mg | SUBCUTANEOUS | 3 refills | Status: DC
  Filled 2023-03-11: qty 3, 28d supply, fill #0
  Filled 2023-04-09: qty 3, 28d supply, fill #1
  Filled 2023-05-08: qty 3, 28d supply, fill #2
  Filled 2023-06-03 – 2023-06-04 (×2): qty 3, 28d supply, fill #3

## 2023-03-18 ENCOUNTER — Other Ambulatory Visit (HOSPITAL_BASED_OUTPATIENT_CLINIC_OR_DEPARTMENT_OTHER): Payer: Self-pay

## 2023-04-09 ENCOUNTER — Other Ambulatory Visit (HOSPITAL_BASED_OUTPATIENT_CLINIC_OR_DEPARTMENT_OTHER): Payer: Self-pay

## 2023-05-08 ENCOUNTER — Other Ambulatory Visit (HOSPITAL_BASED_OUTPATIENT_CLINIC_OR_DEPARTMENT_OTHER): Payer: Self-pay

## 2023-05-10 ENCOUNTER — Other Ambulatory Visit (HOSPITAL_BASED_OUTPATIENT_CLINIC_OR_DEPARTMENT_OTHER): Payer: Self-pay

## 2023-05-14 ENCOUNTER — Other Ambulatory Visit: Payer: Self-pay

## 2023-06-03 ENCOUNTER — Other Ambulatory Visit (HOSPITAL_BASED_OUTPATIENT_CLINIC_OR_DEPARTMENT_OTHER): Payer: Self-pay

## 2023-06-04 ENCOUNTER — Other Ambulatory Visit (HOSPITAL_BASED_OUTPATIENT_CLINIC_OR_DEPARTMENT_OTHER): Payer: Self-pay

## 2023-07-03 ENCOUNTER — Other Ambulatory Visit (HOSPITAL_BASED_OUTPATIENT_CLINIC_OR_DEPARTMENT_OTHER): Payer: Self-pay

## 2023-07-04 ENCOUNTER — Other Ambulatory Visit (HOSPITAL_BASED_OUTPATIENT_CLINIC_OR_DEPARTMENT_OTHER): Payer: Self-pay

## 2023-07-04 MED ORDER — WEGOVY 2.4 MG/0.75ML ~~LOC~~ SOAJ
2.4000 mg | SUBCUTANEOUS | 0 refills | Status: DC
  Filled 2023-07-04: qty 3, 28d supply, fill #0

## 2023-07-15 ENCOUNTER — Other Ambulatory Visit: Payer: Self-pay | Admitting: Family Medicine

## 2023-07-15 DIAGNOSIS — Z1231 Encounter for screening mammogram for malignant neoplasm of breast: Secondary | ICD-10-CM

## 2023-07-16 ENCOUNTER — Other Ambulatory Visit (HOSPITAL_BASED_OUTPATIENT_CLINIC_OR_DEPARTMENT_OTHER): Payer: Self-pay

## 2023-07-16 MED ORDER — WEGOVY 1.7 MG/0.75ML ~~LOC~~ SOAJ
1.7000 mg | SUBCUTANEOUS | 3 refills | Status: AC
  Filled 2023-07-16: qty 3, 28d supply, fill #0
  Filled 2023-09-17: qty 3, 28d supply, fill #1
  Filled 2023-10-15: qty 3, 28d supply, fill #2

## 2023-07-21 ENCOUNTER — Other Ambulatory Visit (HOSPITAL_BASED_OUTPATIENT_CLINIC_OR_DEPARTMENT_OTHER): Payer: Self-pay

## 2023-08-19 ENCOUNTER — Other Ambulatory Visit (HOSPITAL_BASED_OUTPATIENT_CLINIC_OR_DEPARTMENT_OTHER): Payer: Self-pay

## 2023-08-22 ENCOUNTER — Encounter: Payer: Self-pay | Admitting: Obstetrics & Gynecology

## 2023-08-22 ENCOUNTER — Ambulatory Visit: Payer: Commercial Managed Care - PPO | Admitting: Obstetrics & Gynecology

## 2023-08-22 VITALS — BP 131/84 | HR 75 | Ht 60.0 in | Wt 137.6 lb

## 2023-08-22 DIAGNOSIS — Z01419 Encounter for gynecological examination (general) (routine) without abnormal findings: Secondary | ICD-10-CM

## 2023-08-22 NOTE — Progress Notes (Signed)
   WELL-WOMAN EXAMINATION Patient name: Ebony Mahoney MRN [id number]  Date of birth: [birth year] Chief Complaint:   Annual Exam  History of Present Illness:   Ebony Mahoney is a 52 y.o. G26P1001 female being seen today for a routine well-woman exam.   Menses are still every month, bleeding is very light.  Denies intermenstrual bleeding.  Denies irregular discharge, itching or irritation.  Denies pelvic or abdominal pain.  Overall doing well and reports no acute complaints.  Some hot flashes, but tolerable.     Patient's last menstrual period was 08/18/2023 (exact date). Denies issues with her menses The current method of family planning is  perimenopausal .    Last pap 08/2022.  Last mammogram: 07/2022- scheduled for next week. Last colonoscopy: 2021     08/22/2023    9:00 AM 07/10/2021    9:38 AM  Depression screen PHQ 2/9  Decreased Interest 0 0  Down, Depressed, Hopeless 0 0  PHQ - 2 Score 0 0  Altered sleeping 0 0  Tired, decreased energy 0 0  Change in appetite 0 0  Feeling bad or failure about yourself  0 0  Trouble concentrating 0 0  Moving slowly or fidgety/restless 0 0  Suicidal thoughts 0 0  PHQ-9 Score 0 0      Review of Systems:   Pertinent items are noted in HPI Denies any headaches, blurred vision, fatigue, shortness of breath, chest pain, abdominal pain, bowel movements, urination, or intercourse unless otherwise stated above.  Pertinent History Reviewed:  Reviewed past medical,surgical, social and family history.  Reviewed problem list, medications and allergies. Physical Assessment:   Vitals:   08/22/23 0855  BP: 131/84  Pulse: 75  Weight: 137 lb 9.6 oz (62.4 kg)  Height: 5' (1.524 m)  Body mass index is 26.87 kg/m.        Physical Examination:   General appearance - well appearing, and in no distress  Mental status - alert, oriented to person, place, and time  Psych:  She has a normal mood and affect  Skin - warm and dry, normal color,  no suspicious lesions noted  Chest - effort normal, all lung fields clear to auscultation bilaterally  Heart - normal rate and regular rhythm  Neck:  midline trachea, no thyromegaly or nodules  Breasts - breasts appear normal, no suspicious masses, no skin or nipple changes or  axillary nodes  Abdomen - soft, nontender, nondistended, no masses or organomegaly  Pelvic - VULVA: normal appearing vulva with no masses, tenderness or lesions  VAGINA: normal appearing vagina with normal color and discharge, no lesions  CERVIX: normal appearing cervix without discharge or lesions, no CMT  UTERUS: uterus is felt to be normal size, shape, consistency and nontender   ADNEXA: No adnexal masses or tenderness noted.  Extremities:  No swelling or varicosities noted  Chaperone:  pt declined      Assessment & Plan:  1) Well-Woman Exam -pap up to date, reviewed screening guidelines -mammogram scheduled -up to date on colonoscopy  2) perimenopause -reviewed symptoms and menopausal definition  No orders of the defined types were placed in this encounter.   Meds: No orders of the defined types were placed in this encounter.   Follow-up: Return in about 1 year (around 08/21/2024) for Annual.   Myna Hidalgo, DO Attending Obstetrician & Gynecologist, [facility] for [facility], [facility]

## 2023-08-26 ENCOUNTER — Ambulatory Visit
Admission: RE | Admit: 2023-08-26 | Discharge: 2023-08-26 | Disposition: A | Discharge status: Home / Self Care | Payer: Commercial Managed Care - PPO | Source: Ambulatory Visit | Attending: Family Medicine | Admitting: Family Medicine

## 2023-08-26 DIAGNOSIS — Z1231 Encounter for screening mammogram for malignant neoplasm of breast: Secondary | ICD-10-CM

## 2023-09-04 ENCOUNTER — Ambulatory Visit: Payer: Commercial Managed Care - PPO | Admitting: Obstetrics & Gynecology

## 2023-09-17 ENCOUNTER — Other Ambulatory Visit (HOSPITAL_BASED_OUTPATIENT_CLINIC_OR_DEPARTMENT_OTHER): Payer: Self-pay

## 2023-10-11 ENCOUNTER — Other Ambulatory Visit (HOSPITAL_BASED_OUTPATIENT_CLINIC_OR_DEPARTMENT_OTHER): Payer: Self-pay

## 2023-10-15 ENCOUNTER — Other Ambulatory Visit (HOSPITAL_BASED_OUTPATIENT_CLINIC_OR_DEPARTMENT_OTHER): Payer: Self-pay

## 2023-10-15 MED ORDER — WEGOVY 1.7 MG/0.75ML ~~LOC~~ SOAJ
1.7000 mg | SUBCUTANEOUS | 1 refills | Status: DC
  Filled 2023-10-15 – 2023-11-19 (×2): qty 3, 28d supply, fill #0
  Filled 2023-12-17: qty 3, 28d supply, fill #1
  Filled 2024-01-09: qty 3, 28d supply, fill #2
  Filled 2024-02-09: qty 3, 28d supply, fill #3

## 2023-10-17 ENCOUNTER — Other Ambulatory Visit (HOSPITAL_BASED_OUTPATIENT_CLINIC_OR_DEPARTMENT_OTHER): Payer: Self-pay

## 2023-10-24 ENCOUNTER — Other Ambulatory Visit (HOSPITAL_BASED_OUTPATIENT_CLINIC_OR_DEPARTMENT_OTHER): Payer: Self-pay

## 2023-10-30 DIAGNOSIS — K37 Unspecified appendicitis: Secondary | ICD-10-CM

## 2023-10-30 HISTORY — DX: Unspecified appendicitis: K37

## 2023-11-19 ENCOUNTER — Other Ambulatory Visit (HOSPITAL_BASED_OUTPATIENT_CLINIC_OR_DEPARTMENT_OTHER): Payer: Self-pay

## 2023-11-20 ENCOUNTER — Other Ambulatory Visit (HOSPITAL_BASED_OUTPATIENT_CLINIC_OR_DEPARTMENT_OTHER): Payer: Self-pay

## 2023-11-26 ENCOUNTER — Other Ambulatory Visit: Payer: Self-pay | Admitting: Obstetrics & Gynecology

## 2023-11-26 DIAGNOSIS — N951 Menopausal and female climacteric states: Secondary | ICD-10-CM

## 2023-12-02 ENCOUNTER — Ambulatory Visit: Payer: Commercial Managed Care - PPO | Admitting: Obstetrics & Gynecology

## 2023-12-02 ENCOUNTER — Ambulatory Visit: Payer: Commercial Managed Care - PPO | Admitting: Radiology

## 2023-12-02 ENCOUNTER — Encounter: Payer: Self-pay | Admitting: Obstetrics & Gynecology

## 2023-12-02 VITALS — BP 108/71 | HR 75 | Ht 60.0 in | Wt 128.4 lb

## 2023-12-02 DIAGNOSIS — N83201 Unspecified ovarian cyst, right side: Secondary | ICD-10-CM | POA: Diagnosis not present

## 2023-12-02 DIAGNOSIS — N951 Menopausal and female climacteric states: Secondary | ICD-10-CM

## 2023-12-02 DIAGNOSIS — N83202 Unspecified ovarian cyst, left side: Secondary | ICD-10-CM

## 2023-12-02 NOTE — Progress Notes (Signed)
   GYN VISIT Patient name: Ebony Mahoney MRN [id number]  Date of birth: [birth year] Chief Complaint:   Ovarian Cyst (Ultrasound today)  History of Present Illness:   Ebony Mahoney is a 53 y.o. G1P1001 perimenopausal female being seen today for ovarian mass  -Recently underwent laparoscopic appendectomy on 11-11-2023.  In the op note, 4 cm pelvic mass was reported.  She denies pelvic or abdominal pain.  She has noted decreased appetite, but thinks this is due to her recent illness.  She overall reports no acute GYN concerns.    Menses are irregular, patient is known perimenopausal  Pelvic ultrasound completed today.  Normal uterus with 2 small fibroids with one displacing right ovary.  Bilateral ovaries appear normal    Patient's last menstrual period was 11/01/2023.    Review of Systems:   Pertinent items are noted in HPI Denies fever/chills, dizziness, headaches, visual disturbances, fatigue, shortness of breath, chest pain, abdominal pain, vomiting, no problems with periods, bowel movements, urination, or intercourse unless otherwise stated above.  Pertinent History Reviewed:   Past Surgical History:  Procedure Laterality Date   bone spur     CESAREAN SECTION     CHOLECYSTECTOMY     HERNIA REPAIR     THYROID SURGERY      Past Medical History:  Diagnosis Date   Thyroid carcinoma (HCC)    Reviewed problem list, medications and allergies. Physical Assessment:   Vitals:   12/02/23 1106  BP: 108/71  Pulse: 75  Weight: 128 lb 6.4 oz (58.2 kg)  Height: 5' (1.524 m)  Body mass index is 25.08 kg/m.       Physical Examination:   General appearance: alert, well appearing, and in no distress  Psych: mood appropriate, normal affect  Skin: warm & dry   Cardiovascular: normal heart rate noted  Respiratory: normal respiratory effort, no distress  Abdomen: soft, non-tender, no rebound, no guarding Incisions healing appropriately   Pelvic: examination not  indicated  Extremities: no edema   Chaperone:  Dr. Claudean Severance- PGY-2     Assessment & Plan:  1) Ovarian mass -Ultrasound reviewed today no acute abnormality seen.   -Reviewed prior OP note- by today's imaging- suspect this was a uterine fibroid.  Prior ovarian cyst now resolved -Patient asymptomatic -Plan to follow-up for routine annual exams   No orders of the defined types were placed in this encounter.   Return for Nov 2025 annual.   Myna Hidalgo, DO Attending Obstetrician & Gynecologist, [facility] for [facility], [facility]

## 2023-12-02 NOTE — Progress Notes (Signed)
[place]:  TA and TV imaging obtained - Chaperone Quila - vinyl probe cover used Anteverted uterus normal in size.  At least two uterine fibroids seen: Intramural fibroid anterior right wall = 18 x 19 x 12 mm Subserosal fibroid right ut fundal wall = 36 x 32 x 29 mm this fibroid displaces Right ovary anteriorly Em thickness = 5.9 mm, homogenous em cavity, no evidence of intracavitary soft tissue focal defects, avascular cavity and canal. Normal ov's - Left ov seen with corpus luteal = 22 x 19 mm  -   neg adnexal regions Neg CDS - tiny amount of clear free fluid present o/w neg

## 2024-02-11 ENCOUNTER — Other Ambulatory Visit (HOSPITAL_COMMUNITY): Payer: Self-pay

## 2024-02-14 ENCOUNTER — Other Ambulatory Visit (HOSPITAL_COMMUNITY): Payer: Self-pay

## 2024-02-14 ENCOUNTER — Other Ambulatory Visit: Payer: Self-pay

## 2024-02-15 ENCOUNTER — Other Ambulatory Visit (HOSPITAL_COMMUNITY): Payer: Self-pay

## 2024-02-17 ENCOUNTER — Other Ambulatory Visit (HOSPITAL_COMMUNITY): Payer: Self-pay

## 2024-02-17 ENCOUNTER — Encounter (HOSPITAL_COMMUNITY): Payer: Self-pay

## 2024-02-18 ENCOUNTER — Other Ambulatory Visit (HOSPITAL_COMMUNITY): Payer: Self-pay

## 2024-02-27 ENCOUNTER — Other Ambulatory Visit (HOSPITAL_COMMUNITY): Payer: Self-pay

## 2024-03-12 ENCOUNTER — Other Ambulatory Visit (HOSPITAL_COMMUNITY): Payer: Self-pay

## 2024-03-13 ENCOUNTER — Other Ambulatory Visit (HOSPITAL_COMMUNITY): Payer: Self-pay

## 2024-03-13 MED ORDER — WEGOVY 1.7 MG/0.75ML ~~LOC~~ SOAJ
0.7500 mL | SUBCUTANEOUS | 5 refills | Status: DC
  Filled 2024-03-13 – 2024-03-16 (×2): qty 3, 28d supply, fill #0
  Filled 2024-04-08: qty 3, 28d supply, fill #1
  Filled 2024-05-05: qty 3, 28d supply, fill #2
  Filled 2024-05-26: qty 3, 28d supply, fill #3
  Filled 2024-06-26: qty 3, 28d supply, fill #4
  Filled 2024-07-24: qty 3, 28d supply, fill #5

## 2024-03-16 ENCOUNTER — Encounter: Payer: Self-pay | Admitting: Obstetrics & Gynecology

## 2024-03-16 ENCOUNTER — Other Ambulatory Visit (HOSPITAL_BASED_OUTPATIENT_CLINIC_OR_DEPARTMENT_OTHER): Payer: Self-pay

## 2024-03-17 ENCOUNTER — Other Ambulatory Visit (HOSPITAL_COMMUNITY): Payer: Self-pay

## 2024-03-26 ENCOUNTER — Other Ambulatory Visit (HOSPITAL_COMMUNITY): Payer: Self-pay

## 2024-04-23 ENCOUNTER — Other Ambulatory Visit: Payer: Self-pay | Admitting: Family Medicine

## 2024-04-23 DIAGNOSIS — Z1231 Encounter for screening mammogram for malignant neoplasm of breast: Secondary | ICD-10-CM

## 2024-04-30 ENCOUNTER — Other Ambulatory Visit (HOSPITAL_COMMUNITY): Payer: Self-pay

## 2024-05-25 ENCOUNTER — Encounter: Payer: Self-pay | Admitting: Obstetrics & Gynecology

## 2024-05-26 ENCOUNTER — Other Ambulatory Visit (HOSPITAL_COMMUNITY): Payer: Self-pay

## 2024-08-17 ENCOUNTER — Other Ambulatory Visit (HOSPITAL_BASED_OUTPATIENT_CLINIC_OR_DEPARTMENT_OTHER): Payer: Self-pay | Admitting: Family Medicine

## 2024-08-17 DIAGNOSIS — E7800 Pure hypercholesterolemia, unspecified: Secondary | ICD-10-CM

## 2024-08-17 DIAGNOSIS — Z1231 Encounter for screening mammogram for malignant neoplasm of breast: Secondary | ICD-10-CM

## 2024-08-26 ENCOUNTER — Ambulatory Visit
Admission: RE | Admit: 2024-08-26 | Discharge: 2024-08-26 | Disposition: A | Source: Ambulatory Visit | Attending: Family Medicine | Admitting: Family Medicine

## 2024-08-26 DIAGNOSIS — Z1231 Encounter for screening mammogram for malignant neoplasm of breast: Secondary | ICD-10-CM

## 2024-08-28 ENCOUNTER — Other Ambulatory Visit (HOSPITAL_COMMUNITY): Payer: Self-pay

## 2024-08-28 ENCOUNTER — Other Ambulatory Visit: Payer: Self-pay

## 2024-08-28 MED ORDER — WEGOVY 1.7 MG/0.75ML ~~LOC~~ SOAJ
1.7000 mg | SUBCUTANEOUS | 5 refills | Status: AC
  Filled 2024-08-28 – 2024-08-31 (×3): qty 3, 28d supply, fill #0
  Filled 2024-09-18 – 2024-09-24 (×3): qty 3, 28d supply, fill #1
  Filled 2024-10-24: qty 3, 28d supply, fill #2

## 2024-08-31 ENCOUNTER — Other Ambulatory Visit (HOSPITAL_BASED_OUTPATIENT_CLINIC_OR_DEPARTMENT_OTHER): Payer: Self-pay

## 2024-08-31 ENCOUNTER — Other Ambulatory Visit (HOSPITAL_COMMUNITY): Payer: Self-pay

## 2024-09-02 ENCOUNTER — Ambulatory Visit (HOSPITAL_BASED_OUTPATIENT_CLINIC_OR_DEPARTMENT_OTHER)
Admission: RE | Admit: 2024-09-02 | Discharge: 2024-09-02 | Disposition: A | Discharge status: Home / Self Care | Payer: Self-pay | Source: Ambulatory Visit | Attending: Family Medicine | Admitting: Family Medicine

## 2024-09-02 DIAGNOSIS — E7800 Pure hypercholesterolemia, unspecified: Secondary | ICD-10-CM

## 2024-09-04 ENCOUNTER — Encounter: Payer: Self-pay | Admitting: Family Medicine

## 2024-09-18 ENCOUNTER — Other Ambulatory Visit (HOSPITAL_COMMUNITY): Payer: Self-pay

## 2024-09-23 ENCOUNTER — Other Ambulatory Visit (HOSPITAL_COMMUNITY): Payer: Self-pay

## 2024-09-23 ENCOUNTER — Encounter (HOSPITAL_COMMUNITY): Payer: Self-pay

## 2024-09-30 ENCOUNTER — Encounter (HOSPITAL_COMMUNITY): Payer: Self-pay

## 2024-09-30 ENCOUNTER — Other Ambulatory Visit (HOSPITAL_COMMUNITY): Payer: Self-pay

## 2024-11-02 ENCOUNTER — Encounter: Payer: Self-pay | Admitting: Obstetrics & Gynecology

## 2024-11-21 ENCOUNTER — Encounter (HOSPITAL_COMMUNITY): Payer: Self-pay

## 2024-11-23 ENCOUNTER — Other Ambulatory Visit (HOSPITAL_COMMUNITY): Payer: Self-pay

## 2024-11-24 ENCOUNTER — Other Ambulatory Visit (HOSPITAL_BASED_OUTPATIENT_CLINIC_OR_DEPARTMENT_OTHER): Payer: Self-pay

## 2024-11-24 MED ORDER — WEGOVY 1.5 MG PO TABS
1.5000 mg | ORAL_TABLET | Freq: Every morning | ORAL | 0 refills | Status: AC
  Filled 2024-11-24: qty 30, 30d supply, fill #0

## 2024-11-25 ENCOUNTER — Other Ambulatory Visit (HOSPITAL_BASED_OUTPATIENT_CLINIC_OR_DEPARTMENT_OTHER): Payer: Self-pay

## 2024-11-26 ENCOUNTER — Ambulatory Visit (INDEPENDENT_AMBULATORY_CARE_PROVIDER_SITE_OTHER): Admitting: Obstetrics & Gynecology

## 2024-11-26 ENCOUNTER — Encounter: Payer: Self-pay | Admitting: Obstetrics & Gynecology

## 2024-11-26 ENCOUNTER — Other Ambulatory Visit (HOSPITAL_COMMUNITY)
Admission: RE | Admit: 2024-11-26 | Discharge: 2024-11-26 | Disposition: A | Source: Ambulatory Visit | Attending: Obstetrics & Gynecology | Admitting: Obstetrics & Gynecology

## 2024-11-26 VITALS — BP 115/69 | HR 87 | Ht 60.0 in | Wt 135.0 lb

## 2024-11-26 DIAGNOSIS — Z124 Encounter for screening for malignant neoplasm of cervix: Secondary | ICD-10-CM | POA: Insufficient documentation

## 2024-11-26 DIAGNOSIS — Z79890 Hormone replacement therapy (postmenopausal): Secondary | ICD-10-CM

## 2024-11-26 DIAGNOSIS — N951 Menopausal and female climacteric states: Secondary | ICD-10-CM

## 2024-11-26 DIAGNOSIS — Z01419 Encounter for gynecological examination (general) (routine) without abnormal findings: Secondary | ICD-10-CM | POA: Diagnosis not present

## 2024-11-26 MED ORDER — PROGESTERONE MICRONIZED 100 MG PO CAPS: 100.0000 mg | ORAL_CAPSULE | Freq: Every day | ORAL | 3 refills | Status: AC

## 2024-11-26 MED ORDER — ESTRADIOL 0.025 MG/24HR TD PTTW: 1.0000 | MEDICATED_PATCH | TRANSDERMAL | 3 refills | Status: AC

## 2024-11-26 NOTE — Progress Notes (Signed)
 "  WELL-WOMAN EXAMINATION Patient name: Ebony Mahoney MRN [id number]  Date of birth: May 17, 1971 Chief Complaint:   Annual Exam  History of Present Illness:   Ebony Mahoney is a 54 y.o. G1P1001 perimenopausal female being seen today for a routine well-woman exam and the following concerns:  -Perimenopausal- menses are very irregular over the last year:  4/16, 6/23 then not until Oct 11, then 2 weeks later 10/29 extremely light.  Overall she does not report any issues with her menses.  She has started to notice multiple systemic changes including weight gain, hair shedding, anxiety, fatigue and mood changes.  She has done some research on her own and is interested in HRT.  As we discussed last time she did start Estroven complete and has noted some improvement in terms of sleeping and night sweats  Currently rates her symptoms 7/10  Denies abnormal discharge, itching or irritation.  Denies pelvic or abdominal pain.  Patient's last menstrual period was 08/26/2024 (exact date).  The current method of family planning is perimenopause.    Last pap 2023- due today.  Last mammogram: 07/2024. Last colonoscopy: 2022     11/26/2024    3:04 PM 08/22/2023    9:00 AM 07/10/2021    9:38 AM  Depression screen PHQ 2/9  Decreased Interest 0 0 0  Down, Depressed, Hopeless 0 0 0  PHQ - 2 Score 0 0 0  Altered sleeping 0 0 0  Tired, decreased energy 0 0 0  Change in appetite 0 0 0  Feeling bad or failure about yourself  0 0 0  Trouble concentrating 0 0 0  Moving slowly or fidgety/restless 0 0 0  Suicidal thoughts 0 0 0  PHQ-9 Score 0 0  0      Data saved with a previous flowsheet row definition      Review of Systems:   Pertinent items are noted in HPI Denies any headaches, blurred vision, fatigue, shortness of breath, chest pain, abdominal pain, bowel movements, urination, or intercourse unless otherwise stated above.  Pertinent History Reviewed:  Reviewed past medical,surgical,  social and family history.  Reviewed problem list, medications and allergies. Physical Assessment:   Vitals:   11/26/24 1431  BP: 115/69  Pulse: 87  Weight: 135 lb (61.2 kg)  Height: 5' (1.524 m)  Body mass index is 26.37 kg/m.        Physical Examination:   General appearance - well appearing, and in no distress  Mental status - alert, oriented to person, place, and time  Psych:  She has a normal mood and affect  Skin - warm and dry, normal color, no suspicious lesions noted  Chest - effort normal, all lung fields clear to auscultation bilaterally  Heart - normal rate and regular rhythm  Neck:  midline trachea, no thyromegaly or nodules  Breasts - breasts appear normal, no suspicious masses, no skin or nipple changes or  axillary nodes  Abdomen - soft, nontender, nondistended, no masses or organomegaly  Pelvic - VULVA: normal appearing vulva with no masses, tenderness or lesions  VAGINA: normal appearing vagina with normal color and discharge, no lesions  CERVIX: normal appearing cervix without discharge or lesions, no CMT  Thin prep pap is done with HR HPV cotesting  UTERUS: uterus is felt to be normal size, shape, consistency and nontender   ADNEXA: No adnexal masses or tenderness noted.  Extremities:  No swelling or varicosities noted  Chaperone: Aleck Blase     Assessment &  Plan:  1) Well-Woman Exam -Pap collected, reviewed ASCCP guidelines - Mammogram and colonoscopy up-to-date  2) vasomotor symptoms due to perimenopausal status -Continue with Estroven complete -Discussed recent removal of blackbox warning.  Patient does not have any contraindications to medicine - Reviewed risk benefit of HRT.  Discussed low-dose birth control versus patch and Prometrium  tablet.   -Patient desires low-dose patch and tablet []  Plan for follow-up in 3 to 4 months and may adjust medication pending symptoms  No orders of the defined types were placed in this encounter.   Meds:   Meds ordered this encounter  Medications   estradiol  (VIVELLE -DOT) 0.025 MG/24HR    Sig: Place 1 patch onto the skin 2 (two) times a week.    Dispense:  24 patch    Refill:  3   progesterone  (PROMETRIUM ) 100 MG capsule    Sig: Take 1 capsule (100 mg total) by mouth daily.    Dispense:  90 capsule    Refill:  3    Follow-up: Return in about 3 months (around 02/24/2025) for med follow up (remote visit).   Edan Serratore, DO Attending Obstetrician & Gynecologist, [facility] for [facility], [facility]   "

## 2024-11-27 ENCOUNTER — Other Ambulatory Visit (HOSPITAL_BASED_OUTPATIENT_CLINIC_OR_DEPARTMENT_OTHER): Payer: Self-pay

## 2024-11-30 LAB — CYTOLOGY - PAP
Adequacy: ABSENT
Comment: NEGATIVE
Diagnosis: NEGATIVE
High risk HPV: NEGATIVE

## 2024-12-01 ENCOUNTER — Ambulatory Visit: Payer: Self-pay | Admitting: Obstetrics & Gynecology
